# Patient Record
Sex: Male | Born: 1945 | Race: White | Hispanic: No | Marital: Married | Smoking: Former smoker
Health system: Southern US, Community
[De-identification: ages and names within clinical notes are randomized; demographics above are authoritative.]

## PROBLEM LIST (undated history)

## (undated) DIAGNOSIS — Z87898 Personal history of other specified conditions: Secondary | ICD-10-CM

## (undated) DIAGNOSIS — N529 Male erectile dysfunction, unspecified: Secondary | ICD-10-CM

## (undated) DIAGNOSIS — K5790 Diverticulosis of intestine, part unspecified, without perforation or abscess without bleeding: Secondary | ICD-10-CM

## (undated) DIAGNOSIS — I10 Essential (primary) hypertension: Secondary | ICD-10-CM

## (undated) DIAGNOSIS — Z87828 Personal history of other (healed) physical injury and trauma: Secondary | ICD-10-CM

## (undated) DIAGNOSIS — R351 Nocturia: Secondary | ICD-10-CM

## (undated) DIAGNOSIS — D6859 Other primary thrombophilia: Secondary | ICD-10-CM

## (undated) DIAGNOSIS — R6510 Systemic inflammatory response syndrome (SIRS) of non-infectious origin without acute organ dysfunction: Secondary | ICD-10-CM

## (undated) DIAGNOSIS — F32A Depression, unspecified: Secondary | ICD-10-CM

## (undated) DIAGNOSIS — Z8551 Personal history of malignant neoplasm of bladder: Secondary | ICD-10-CM

## (undated) DIAGNOSIS — F329 Major depressive disorder, single episode, unspecified: Secondary | ICD-10-CM

## (undated) DIAGNOSIS — K219 Gastro-esophageal reflux disease without esophagitis: Secondary | ICD-10-CM

## (undated) DIAGNOSIS — Z7901 Long term (current) use of anticoagulants: Secondary | ICD-10-CM

## (undated) DIAGNOSIS — I82890 Acute embolism and thrombosis of other specified veins: Secondary | ICD-10-CM

## (undated) DIAGNOSIS — E785 Hyperlipidemia, unspecified: Secondary | ICD-10-CM

## (undated) DIAGNOSIS — J45909 Unspecified asthma, uncomplicated: Secondary | ICD-10-CM

## (undated) DIAGNOSIS — R972 Elevated prostate specific antigen [PSA]: Secondary | ICD-10-CM

## (undated) DIAGNOSIS — Z87442 Personal history of urinary calculi: Secondary | ICD-10-CM

## (undated) DIAGNOSIS — Z8546 Personal history of malignant neoplasm of prostate: Secondary | ICD-10-CM

## (undated) DIAGNOSIS — A770 Spotted fever due to Rickettsia rickettsii: Secondary | ICD-10-CM

## (undated) DIAGNOSIS — I4891 Unspecified atrial fibrillation: Secondary | ICD-10-CM

## (undated) DIAGNOSIS — M1990 Unspecified osteoarthritis, unspecified site: Secondary | ICD-10-CM

## (undated) DIAGNOSIS — K55059 Acute (reversible) ischemia of intestine, part and extent unspecified: Secondary | ICD-10-CM

## (undated) HISTORY — DX: Gastro-esophageal reflux disease without esophagitis: K21.9

## (undated) HISTORY — DX: Acute (reversible) ischemia of intestine, part and extent unspecified: K55.059

## (undated) HISTORY — DX: Elevated prostate specific antigen (PSA): R97.20

## (undated) HISTORY — DX: Spotted fever due to Rickettsia rickettsii: A77.0

## (undated) HISTORY — DX: Other primary thrombophilia: D68.59

## (undated) HISTORY — DX: Diverticulosis of intestine, part unspecified, without perforation or abscess without bleeding: K57.90

## (undated) HISTORY — PX: SHOULDER ARTHROSCOPY: SHX128

## (undated) HISTORY — PX: KNEE ARTHROSCOPY: SHX127

## (undated) HISTORY — DX: Essential (primary) hypertension: I10

## (undated) HISTORY — PX: TONSILLECTOMY AND ADENOIDECTOMY: SUR1326

## (undated) HISTORY — DX: Systemic inflammatory response syndrome (sirs) of non-infectious origin without acute organ dysfunction: R65.10

## (undated) HISTORY — DX: Unspecified atrial fibrillation: I48.91

## (undated) HISTORY — DX: Hyperlipidemia, unspecified: E78.5

## (undated) HISTORY — PX: ELBOW ARTHROSCOPY: SUR87

## (undated) HISTORY — DX: Unspecified asthma, uncomplicated: J45.909

## (undated) HISTORY — PX: APPENDECTOMY: SHX54

---

## 2003-06-16 ENCOUNTER — Encounter: Payer: Self-pay | Admitting: Internal Medicine

## 2004-01-21 HISTORY — PX: CARDIOVASCULAR STRESS TEST: SHX262

## 2005-12-05 HISTORY — PX: URETEROLITHOTOMY: SHX71

## 2006-04-23 ENCOUNTER — Emergency Department (HOSPITAL_COMMUNITY): Admission: EM | Admit: 2006-04-23 | Discharge: 2006-04-23 | Payer: Self-pay | Admitting: Emergency Medicine

## 2006-04-28 ENCOUNTER — Inpatient Hospital Stay (HOSPITAL_COMMUNITY): Admission: EM | Admit: 2006-04-28 | Discharge: 2006-04-30 | Payer: Self-pay | Admitting: Emergency Medicine

## 2006-04-29 ENCOUNTER — Ambulatory Visit: Payer: Self-pay | Admitting: Internal Medicine

## 2006-05-02 ENCOUNTER — Ambulatory Visit: Payer: Self-pay | Admitting: Internal Medicine

## 2006-05-05 ENCOUNTER — Ambulatory Visit: Payer: Self-pay | Admitting: Internal Medicine

## 2006-05-08 ENCOUNTER — Ambulatory Visit: Payer: Self-pay | Admitting: Internal Medicine

## 2006-05-22 ENCOUNTER — Ambulatory Visit: Payer: Self-pay | Admitting: Internal Medicine

## 2006-06-13 ENCOUNTER — Ambulatory Visit: Payer: Self-pay | Admitting: Internal Medicine

## 2006-07-04 ENCOUNTER — Ambulatory Visit: Payer: Self-pay | Admitting: Internal Medicine

## 2006-08-14 ENCOUNTER — Ambulatory Visit (HOSPITAL_COMMUNITY): Admission: RE | Admit: 2006-08-14 | Discharge: 2006-08-15 | Payer: Self-pay | Admitting: Urology

## 2006-11-09 ENCOUNTER — Ambulatory Visit: Payer: Self-pay | Admitting: Internal Medicine

## 2007-01-29 ENCOUNTER — Ambulatory Visit: Payer: Self-pay | Admitting: Internal Medicine

## 2007-01-29 LAB — CONVERTED CEMR LAB
BUN: 9 mg/dL (ref 6–23)
CO2: 30 meq/L (ref 19–32)
Calcium: 9.7 mg/dL (ref 8.4–10.5)
Chloride: 103 meq/L (ref 96–112)
Creatinine, Ser: 1.2 mg/dL (ref 0.4–1.5)
GFR calc Af Amer: 79 mL/min
GFR calc non Af Amer: 65 mL/min
Glucose, Bld: 86 mg/dL (ref 70–99)
PSA: 4.11 ng/mL — ABNORMAL HIGH (ref 0.10–4.00)
Potassium: 4.4 meq/L (ref 3.5–5.1)
Sodium: 142 meq/L (ref 135–145)
Testosterone: 529.87 ng/dL (ref 350.00–890)

## 2007-02-26 ENCOUNTER — Ambulatory Visit: Payer: Self-pay | Admitting: Internal Medicine

## 2007-04-02 ENCOUNTER — Ambulatory Visit: Payer: Self-pay | Admitting: Internal Medicine

## 2007-06-07 DIAGNOSIS — I10 Essential (primary) hypertension: Secondary | ICD-10-CM | POA: Insufficient documentation

## 2007-08-14 ENCOUNTER — Telehealth: Payer: Self-pay | Admitting: Internal Medicine

## 2007-08-17 ENCOUNTER — Telehealth: Payer: Self-pay | Admitting: Internal Medicine

## 2007-09-24 ENCOUNTER — Ambulatory Visit: Payer: Self-pay | Admitting: Internal Medicine

## 2007-09-24 ENCOUNTER — Telehealth (INDEPENDENT_AMBULATORY_CARE_PROVIDER_SITE_OTHER): Payer: Self-pay | Admitting: *Deleted

## 2007-09-24 ENCOUNTER — Encounter: Payer: Self-pay | Admitting: *Deleted

## 2007-09-24 DIAGNOSIS — D6859 Other primary thrombophilia: Secondary | ICD-10-CM | POA: Insufficient documentation

## 2007-09-24 DIAGNOSIS — Z87442 Personal history of urinary calculi: Secondary | ICD-10-CM | POA: Insufficient documentation

## 2007-09-24 LAB — CONVERTED CEMR LAB
INR: 2.8
Prothrombin Time: 20.4 s

## 2007-09-27 ENCOUNTER — Encounter: Payer: Self-pay | Admitting: Internal Medicine

## 2007-09-28 ENCOUNTER — Encounter: Payer: Self-pay | Admitting: Internal Medicine

## 2007-12-06 HISTORY — PX: OTHER SURGICAL HISTORY: SHX169

## 2007-12-10 ENCOUNTER — Ambulatory Visit: Payer: Self-pay | Admitting: Internal Medicine

## 2007-12-10 DIAGNOSIS — K55059 Acute (reversible) ischemia of intestine, part and extent unspecified: Secondary | ICD-10-CM | POA: Insufficient documentation

## 2007-12-10 LAB — CONVERTED CEMR LAB
INR: 2.8
Prothrombin Time: 20.1 s

## 2008-03-24 ENCOUNTER — Telehealth: Payer: Self-pay | Admitting: Internal Medicine

## 2008-03-26 ENCOUNTER — Ambulatory Visit: Payer: Self-pay | Admitting: Internal Medicine

## 2008-07-05 HISTORY — PX: OTHER SURGICAL HISTORY: SHX169

## 2008-07-09 ENCOUNTER — Telehealth: Payer: Self-pay | Admitting: Internal Medicine

## 2008-07-15 ENCOUNTER — Encounter: Payer: Self-pay | Admitting: Internal Medicine

## 2008-07-16 ENCOUNTER — Telehealth (INDEPENDENT_AMBULATORY_CARE_PROVIDER_SITE_OTHER): Payer: Self-pay | Admitting: *Deleted

## 2008-08-01 ENCOUNTER — Telehealth: Payer: Self-pay | Admitting: Internal Medicine

## 2008-08-02 ENCOUNTER — Ambulatory Visit: Payer: Self-pay | Admitting: Internal Medicine

## 2008-08-02 ENCOUNTER — Telehealth: Payer: Self-pay | Admitting: Internal Medicine

## 2008-08-02 ENCOUNTER — Ambulatory Visit: Payer: Self-pay | Admitting: *Deleted

## 2008-08-02 ENCOUNTER — Encounter: Payer: Self-pay | Admitting: Internal Medicine

## 2008-08-02 ENCOUNTER — Ambulatory Visit: Payer: Self-pay | Admitting: Pulmonary Disease

## 2008-08-02 ENCOUNTER — Inpatient Hospital Stay (HOSPITAL_COMMUNITY): Admission: EM | Admit: 2008-08-02 | Discharge: 2008-08-10 | Payer: Self-pay | Admitting: Emergency Medicine

## 2008-08-03 ENCOUNTER — Ambulatory Visit: Payer: Self-pay | Admitting: Pulmonary Disease

## 2008-08-03 ENCOUNTER — Encounter: Payer: Self-pay | Admitting: Internal Medicine

## 2008-08-03 ENCOUNTER — Ambulatory Visit: Payer: Self-pay | Admitting: Cardiology

## 2008-08-03 ENCOUNTER — Ambulatory Visit: Payer: Self-pay | Admitting: Internal Medicine

## 2008-08-04 ENCOUNTER — Encounter: Payer: Self-pay | Admitting: Internal Medicine

## 2008-08-12 ENCOUNTER — Telehealth: Payer: Self-pay | Admitting: Internal Medicine

## 2008-08-13 ENCOUNTER — Ambulatory Visit: Payer: Self-pay | Admitting: Internal Medicine

## 2008-08-14 ENCOUNTER — Telehealth: Payer: Self-pay | Admitting: Internal Medicine

## 2008-08-15 ENCOUNTER — Telehealth: Payer: Self-pay | Admitting: Internal Medicine

## 2008-08-19 ENCOUNTER — Ambulatory Visit: Payer: Self-pay | Admitting: Internal Medicine

## 2008-08-19 LAB — CONVERTED CEMR LAB: Prothrombin Time: 23.6 s

## 2008-08-20 HISTORY — PX: TRANSTHORACIC ECHOCARDIOGRAM: SHX275

## 2008-08-26 ENCOUNTER — Ambulatory Visit: Payer: Self-pay | Admitting: Internal Medicine

## 2008-08-27 ENCOUNTER — Encounter: Payer: Self-pay | Admitting: Internal Medicine

## 2008-08-29 ENCOUNTER — Telehealth: Payer: Self-pay | Admitting: Internal Medicine

## 2008-09-01 ENCOUNTER — Ambulatory Visit: Payer: Self-pay | Admitting: Internal Medicine

## 2008-09-01 LAB — CONVERTED CEMR LAB
Albumin: 3.3 g/dL — ABNORMAL LOW (ref 3.5–5.2)
Basophils Relative: 0.3 % (ref 0.0–3.0)
Bilirubin, Direct: 0.1 mg/dL (ref 0.0–0.3)
CO2: 31 meq/L (ref 19–32)
Calcium: 9.2 mg/dL (ref 8.4–10.5)
Chloride: 110 meq/L (ref 96–112)
Creatinine, Ser: 1.1 mg/dL (ref 0.4–1.5)
Eosinophils Absolute: 0.2 10*3/uL (ref 0.0–0.7)
GFR calc non Af Amer: 72 mL/min
Glucose, Bld: 112 mg/dL — ABNORMAL HIGH (ref 70–99)
Ketones, ur: NEGATIVE mg/dL
Lymphocytes Relative: 16.2 % (ref 12.0–46.0)
MCHC: 33.9 g/dL (ref 30.0–36.0)
Monocytes Absolute: 0.4 10*3/uL (ref 0.1–1.0)
Platelets: 415 10*3/uL — ABNORMAL HIGH (ref 150–400)
Potassium: 5 meq/L (ref 3.5–5.1)
Prothrombin Time: 20.9 s — ABNORMAL HIGH (ref 10.9–13.3)
RBC: 4.8 M/uL (ref 4.22–5.81)
Total Bilirubin: 0.7 mg/dL (ref 0.3–1.2)
Total Protein, Urine: 30 mg/dL — AB
Total Protein: 7.1 g/dL (ref 6.0–8.3)
pH: 5 (ref 5.0–8.0)

## 2008-09-02 ENCOUNTER — Telehealth: Payer: Self-pay | Admitting: Internal Medicine

## 2008-09-02 ENCOUNTER — Ambulatory Visit: Payer: Self-pay | Admitting: Internal Medicine

## 2008-09-02 ENCOUNTER — Encounter: Payer: Self-pay | Admitting: Internal Medicine

## 2008-09-03 ENCOUNTER — Encounter: Payer: Self-pay | Admitting: Internal Medicine

## 2008-09-09 ENCOUNTER — Ambulatory Visit: Payer: Self-pay | Admitting: Internal Medicine

## 2008-09-10 ENCOUNTER — Telehealth: Payer: Self-pay | Admitting: Internal Medicine

## 2008-09-11 ENCOUNTER — Encounter: Payer: Self-pay | Admitting: Internal Medicine

## 2008-09-12 ENCOUNTER — Encounter: Payer: Self-pay | Admitting: Internal Medicine

## 2008-09-17 ENCOUNTER — Telehealth: Payer: Self-pay | Admitting: Internal Medicine

## 2008-09-23 ENCOUNTER — Encounter: Payer: Self-pay | Admitting: Internal Medicine

## 2008-09-25 ENCOUNTER — Ambulatory Visit: Payer: Self-pay | Admitting: Internal Medicine

## 2008-10-02 ENCOUNTER — Ambulatory Visit: Payer: Self-pay | Admitting: Internal Medicine

## 2008-10-20 ENCOUNTER — Encounter: Admission: RE | Admit: 2008-10-20 | Discharge: 2008-10-20 | Payer: Self-pay | Admitting: Orthopaedic Surgery

## 2008-11-06 ENCOUNTER — Ambulatory Visit: Payer: Self-pay | Admitting: Internal Medicine

## 2008-11-06 DIAGNOSIS — I4891 Unspecified atrial fibrillation: Secondary | ICD-10-CM | POA: Insufficient documentation

## 2008-11-06 LAB — CONVERTED CEMR LAB: INR: 1.2

## 2008-11-12 ENCOUNTER — Ambulatory Visit: Payer: Self-pay | Admitting: Cardiology

## 2008-11-12 ENCOUNTER — Encounter: Payer: Self-pay | Admitting: Cardiology

## 2008-11-12 ENCOUNTER — Ambulatory Visit: Payer: Self-pay | Admitting: Internal Medicine

## 2008-11-12 ENCOUNTER — Ambulatory Visit: Payer: Self-pay

## 2008-11-17 ENCOUNTER — Ambulatory Visit: Payer: Self-pay | Admitting: Cardiology

## 2008-11-19 ENCOUNTER — Telehealth: Payer: Self-pay | Admitting: Internal Medicine

## 2008-11-20 ENCOUNTER — Encounter: Payer: Self-pay | Admitting: Internal Medicine

## 2008-11-26 ENCOUNTER — Ambulatory Visit: Payer: Self-pay | Admitting: Cardiovascular Disease

## 2008-12-10 ENCOUNTER — Ambulatory Visit: Payer: Self-pay | Admitting: Internal Medicine

## 2008-12-15 ENCOUNTER — Ambulatory Visit: Payer: Self-pay | Admitting: Cardiology

## 2008-12-23 ENCOUNTER — Ambulatory Visit: Payer: Self-pay | Admitting: Cardiology

## 2008-12-30 ENCOUNTER — Ambulatory Visit: Payer: Self-pay | Admitting: Cardiology

## 2009-01-05 ENCOUNTER — Ambulatory Visit: Payer: Self-pay | Admitting: Cardiology

## 2009-01-19 ENCOUNTER — Ambulatory Visit: Payer: Self-pay | Admitting: Internal Medicine

## 2009-01-20 ENCOUNTER — Ambulatory Visit (HOSPITAL_COMMUNITY): Admission: RE | Admit: 2009-01-20 | Discharge: 2009-01-20 | Payer: Self-pay | Admitting: Cardiology

## 2009-01-20 ENCOUNTER — Ambulatory Visit: Payer: Self-pay | Admitting: Cardiology

## 2009-01-30 ENCOUNTER — Ambulatory Visit: Payer: Self-pay | Admitting: Cardiology

## 2009-02-05 ENCOUNTER — Ambulatory Visit: Payer: Self-pay | Admitting: Cardiology

## 2009-03-02 ENCOUNTER — Telehealth: Payer: Self-pay | Admitting: Internal Medicine

## 2009-03-31 ENCOUNTER — Telehealth: Payer: Self-pay | Admitting: Internal Medicine

## 2009-04-04 DIAGNOSIS — E785 Hyperlipidemia, unspecified: Secondary | ICD-10-CM | POA: Insufficient documentation

## 2009-04-06 ENCOUNTER — Telehealth (INDEPENDENT_AMBULATORY_CARE_PROVIDER_SITE_OTHER): Payer: Self-pay | Admitting: Cardiology

## 2009-04-06 ENCOUNTER — Ambulatory Visit: Payer: Self-pay | Admitting: Cardiology

## 2009-04-14 ENCOUNTER — Telehealth: Payer: Self-pay | Admitting: Internal Medicine

## 2009-05-05 ENCOUNTER — Encounter: Payer: Self-pay | Admitting: *Deleted

## 2009-05-18 ENCOUNTER — Ambulatory Visit: Payer: Self-pay | Admitting: Cardiology

## 2009-05-18 ENCOUNTER — Encounter: Payer: Self-pay | Admitting: Internal Medicine

## 2009-05-20 ENCOUNTER — Telehealth: Payer: Self-pay | Admitting: Internal Medicine

## 2009-05-25 ENCOUNTER — Telehealth (INDEPENDENT_AMBULATORY_CARE_PROVIDER_SITE_OTHER): Payer: Self-pay | Admitting: *Deleted

## 2009-06-10 ENCOUNTER — Encounter: Payer: Self-pay | Admitting: *Deleted

## 2009-06-23 ENCOUNTER — Ambulatory Visit: Payer: Self-pay | Admitting: Internal Medicine

## 2009-06-23 LAB — CONVERTED CEMR LAB: INR: 1.5

## 2009-07-23 ENCOUNTER — Encounter: Payer: Self-pay | Admitting: Internal Medicine

## 2009-08-17 ENCOUNTER — Telehealth: Payer: Self-pay | Admitting: Internal Medicine

## 2009-08-27 ENCOUNTER — Telehealth: Payer: Self-pay | Admitting: Internal Medicine

## 2009-08-28 ENCOUNTER — Ambulatory Visit: Payer: Self-pay | Admitting: Internal Medicine

## 2009-08-28 DIAGNOSIS — R413 Other amnesia: Secondary | ICD-10-CM | POA: Insufficient documentation

## 2009-08-31 LAB — CONVERTED CEMR LAB
Albumin: 4 g/dL (ref 3.5–5.2)
Alkaline Phosphatase: 59 units/L (ref 39–117)
Basophils Relative: 0.3 % (ref 0.0–3.0)
CO2: 29 meq/L (ref 19–32)
Chloride: 104 meq/L (ref 96–112)
Eosinophils Absolute: 0.3 10*3/uL (ref 0.0–0.7)
Folate: 16.5 ng/mL
HCT: 53.7 % — ABNORMAL HIGH (ref 39.0–52.0)
Hemoglobin: 17.6 g/dL — ABNORMAL HIGH (ref 13.0–17.0)
Lymphocytes Relative: 31.6 % (ref 12.0–46.0)
MCHC: 32.7 g/dL (ref 30.0–36.0)
MCV: 88.4 fL (ref 78.0–100.0)
Monocytes Absolute: 0.4 10*3/uL (ref 0.1–1.0)
Neutro Abs: 3 10*3/uL (ref 1.4–7.7)
RBC: 6.08 M/uL — ABNORMAL HIGH (ref 4.22–5.81)
Sodium: 140 meq/L (ref 135–145)
Total Protein: 7.1 g/dL (ref 6.0–8.3)

## 2009-09-12 ENCOUNTER — Encounter: Admission: RE | Admit: 2009-09-12 | Discharge: 2009-09-12 | Payer: Self-pay | Admitting: Internal Medicine

## 2009-09-16 ENCOUNTER — Telehealth: Payer: Self-pay | Admitting: Internal Medicine

## 2009-09-30 ENCOUNTER — Ambulatory Visit: Payer: Self-pay | Admitting: Internal Medicine

## 2009-10-02 LAB — CONVERTED CEMR LAB
Basophils Relative: 0.3 % (ref 0.0–3.0)
Eosinophils Absolute: 0.2 10*3/uL (ref 0.0–0.7)
Lymphs Abs: 2.1 10*3/uL (ref 0.7–4.0)
MCHC: 33.7 g/dL (ref 30.0–36.0)
MCV: 89 fL (ref 78.0–100.0)
Monocytes Absolute: 0.7 10*3/uL (ref 0.1–1.0)
Neutro Abs: 4 10*3/uL (ref 1.4–7.7)
Neutrophils Relative %: 56.6 % (ref 43.0–77.0)
RBC: 5.81 M/uL (ref 4.22–5.81)

## 2009-10-28 ENCOUNTER — Ambulatory Visit: Payer: Self-pay | Admitting: Internal Medicine

## 2009-10-28 LAB — CONVERTED CEMR LAB: INR: 1.3
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
## 2009-11-07 IMAGING — CR DG CHEST 1V PORT
1 series · 1 of 1 positions shown · non-contrast
Comparison: 08/04/2008

CLINICAL DATA: Pneumonia.  Left thoracentesis.

PORTABLE CHEST - 1 VIEW

[view not recorded]
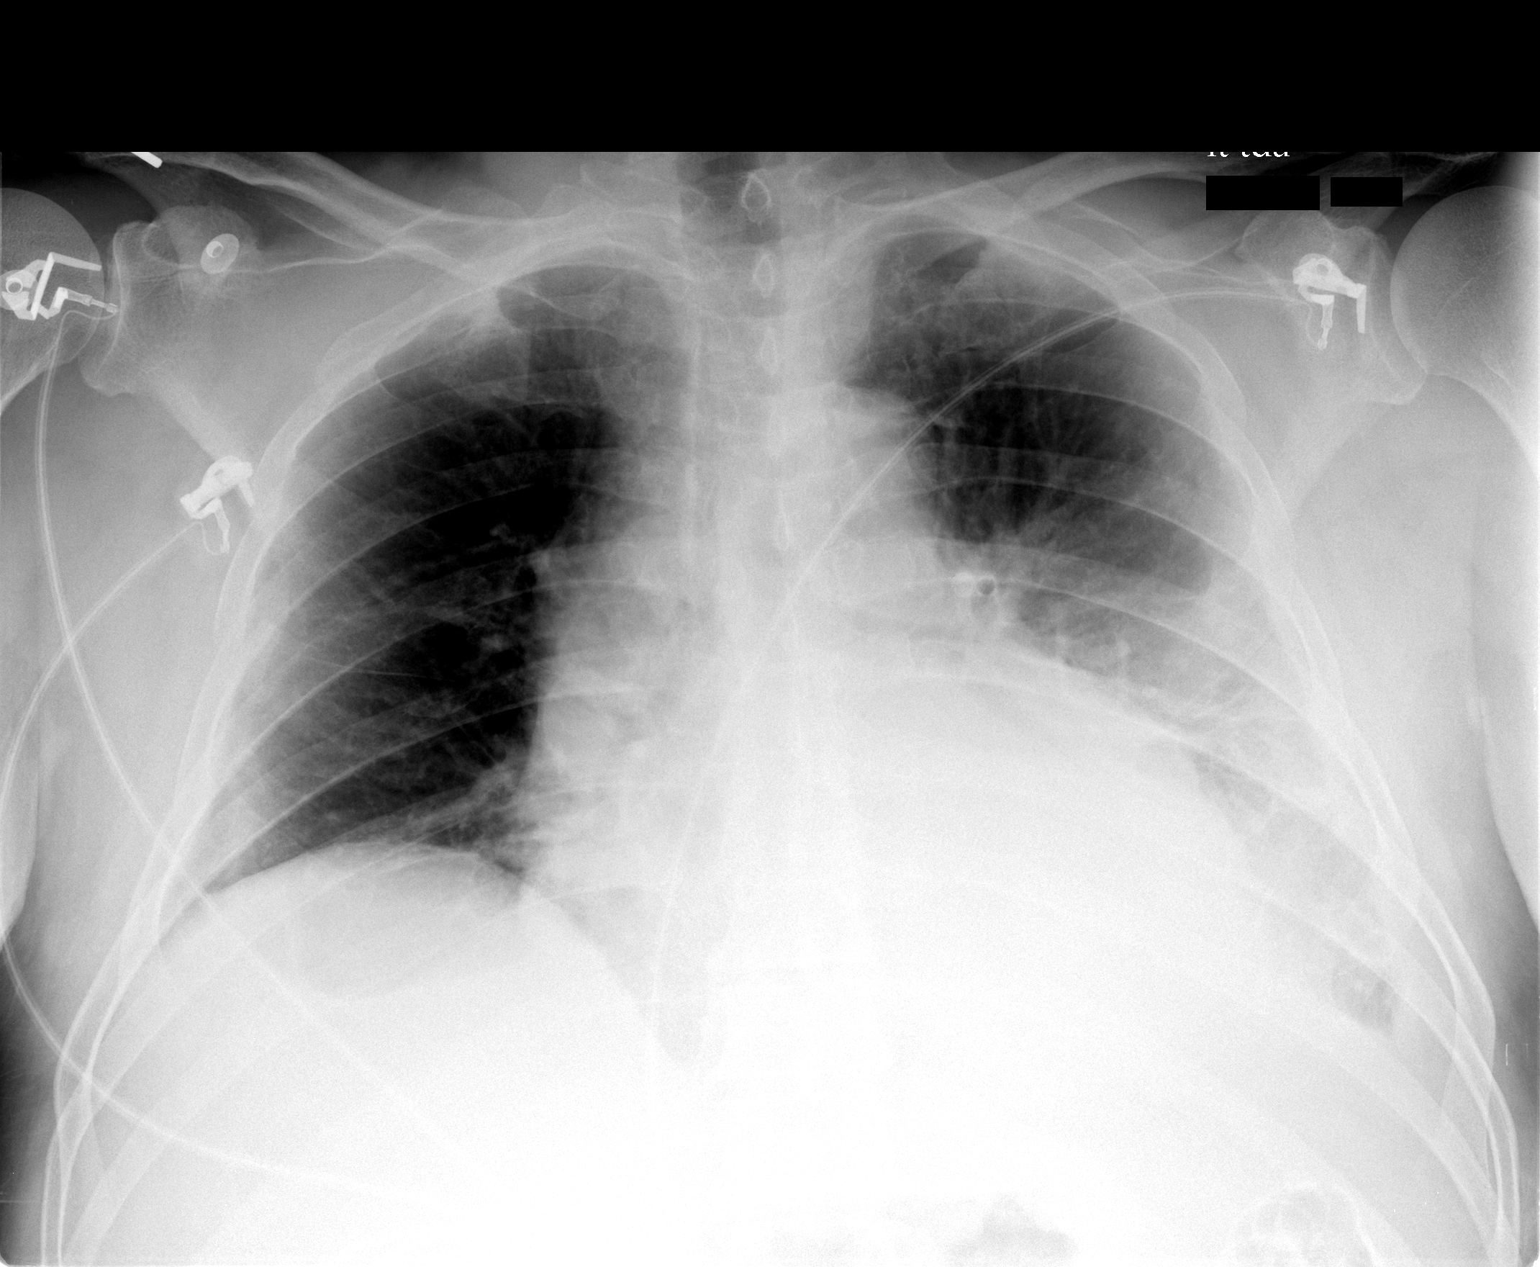

[1 of 1 positions shown; findings below may reference images not displayed]

FINDINGS: Negative for pneumothorax post left thoracentesis.  There
remains left pleural effusion and left lower lobe consolidation
which are unchanged.  There is mild right lower lobe atelectasis.
There is no significant effusion on the right.
IMPRESSION: No pneumothorax following left thoracentesis.

No change in left lower lobe consolidation and effusion.

## 2009-11-10 ENCOUNTER — Telehealth: Payer: Self-pay | Admitting: Internal Medicine

## 2009-11-11 ENCOUNTER — Telehealth: Payer: Self-pay | Admitting: Internal Medicine

## 2009-11-11 ENCOUNTER — Ambulatory Visit: Payer: Self-pay | Admitting: Internal Medicine

## 2009-11-12 ENCOUNTER — Telehealth (INDEPENDENT_AMBULATORY_CARE_PROVIDER_SITE_OTHER): Payer: Self-pay | Admitting: *Deleted

## 2009-11-20 ENCOUNTER — Telehealth: Payer: Self-pay | Admitting: Internal Medicine

## 2009-11-26 ENCOUNTER — Telehealth: Payer: Self-pay | Admitting: Cardiology

## 2009-12-08 ENCOUNTER — Encounter: Payer: Self-pay | Admitting: Internal Medicine

## 2010-01-05 ENCOUNTER — Ambulatory Visit: Payer: Self-pay | Admitting: Family Medicine

## 2010-02-19 ENCOUNTER — Ambulatory Visit: Payer: Self-pay | Admitting: Internal Medicine

## 2010-02-19 LAB — CONVERTED CEMR LAB: INR: 2.8

## 2010-04-22 ENCOUNTER — Telehealth: Payer: Self-pay | Admitting: Internal Medicine

## 2010-05-07 ENCOUNTER — Telehealth: Payer: Self-pay | Admitting: Internal Medicine

## 2010-05-10 ENCOUNTER — Telehealth: Payer: Self-pay | Admitting: Internal Medicine

## 2010-05-27 ENCOUNTER — Ambulatory Visit: Payer: Self-pay | Admitting: Family Medicine

## 2010-05-27 LAB — CONVERTED CEMR LAB: INR: 1.4

## 2010-08-24 ENCOUNTER — Encounter: Payer: Self-pay | Admitting: Internal Medicine

## 2010-08-27 ENCOUNTER — Telehealth: Payer: Self-pay | Admitting: *Deleted

## 2010-09-17 ENCOUNTER — Ambulatory Visit: Admission: RE | Admit: 2010-09-17 | Discharge: 2010-10-21 | Payer: Self-pay | Admitting: Radiation Oncology

## 2010-09-27 ENCOUNTER — Telehealth: Payer: Self-pay | Admitting: Internal Medicine

## 2010-10-05 ENCOUNTER — Encounter: Payer: Self-pay | Admitting: Internal Medicine

## 2010-10-07 ENCOUNTER — Ambulatory Visit: Payer: Self-pay | Admitting: Internal Medicine

## 2010-10-07 DIAGNOSIS — C61 Malignant neoplasm of prostate: Secondary | ICD-10-CM | POA: Insufficient documentation

## 2010-10-08 LAB — CONVERTED CEMR LAB
Albumin: 3.9 g/dL (ref 3.5–5.2)
Basophils Absolute: 0 10*3/uL (ref 0.0–0.1)
Basophils Relative: 0.7 % (ref 0.0–3.0)
Chloride: 104 meq/L (ref 96–112)
Cholesterol: 247 mg/dL — ABNORMAL HIGH (ref 0–200)
HDL: 41 mg/dL (ref >39.00)
Hemoglobin: 17.3 g/dL — ABNORMAL HIGH (ref 13.0–17.0)
Lymphocytes Relative: 29.7 % (ref 12.0–46.0)
Monocytes Relative: 8.1 % (ref 3.0–12.0)
Neutro Abs: 4.2 10*3/uL (ref 1.4–7.7)
Neutrophils Relative %: 56.7 % (ref 43.0–77.0)
Potassium: 5.4 meq/L — ABNORMAL HIGH (ref 3.5–5.1)
RBC: 5.99 M/uL — ABNORMAL HIGH (ref 4.22–5.81)
Sodium: 140 meq/L (ref 135–145)
TSH: 2.53 microintl units/mL (ref 0.35–5.50)
Total CHOL/HDL Ratio: 6
Triglycerides: 359 mg/dL — ABNORMAL HIGH (ref 0.0–149.0)
VLDL: 71.8 mg/dL — ABNORMAL HIGH (ref 0.0–40.0)

## 2010-10-18 ENCOUNTER — Telehealth: Payer: Self-pay | Admitting: Internal Medicine

## 2010-10-19 ENCOUNTER — Telehealth: Payer: Self-pay | Admitting: Gastroenterology

## 2010-10-22 ENCOUNTER — Encounter: Payer: Self-pay | Admitting: Internal Medicine

## 2010-10-22 ENCOUNTER — Encounter: Payer: Self-pay | Admitting: Gastroenterology

## 2010-10-22 ENCOUNTER — Ambulatory Visit: Payer: Self-pay | Admitting: Nurse Practitioner

## 2010-10-22 DIAGNOSIS — R198 Other specified symptoms and signs involving the digestive system and abdomen: Secondary | ICD-10-CM | POA: Insufficient documentation

## 2010-10-22 DIAGNOSIS — R197 Diarrhea, unspecified: Secondary | ICD-10-CM | POA: Insufficient documentation

## 2010-11-08 ENCOUNTER — Ambulatory Visit: Payer: Self-pay | Admitting: Gastroenterology

## 2010-11-08 LAB — HM COLONOSCOPY

## 2010-11-10 ENCOUNTER — Telehealth: Payer: Self-pay | Admitting: Gastroenterology

## 2010-11-10 ENCOUNTER — Encounter: Payer: Self-pay | Admitting: Gastroenterology

## 2010-11-12 ENCOUNTER — Telehealth: Payer: Self-pay | Admitting: Gastroenterology

## 2010-11-15 ENCOUNTER — Ambulatory Visit: Payer: Self-pay | Admitting: Gastroenterology

## 2010-11-26 ENCOUNTER — Ambulatory Visit: Payer: Self-pay | Admitting: Internal Medicine

## 2010-11-26 ENCOUNTER — Ambulatory Visit
Admission: RE | Admit: 2010-11-26 | Discharge: 2011-01-04 | Payer: Self-pay | Source: Home / Self Care | Attending: Radiation Oncology | Admitting: Radiation Oncology

## 2010-11-26 DIAGNOSIS — R059 Cough, unspecified: Secondary | ICD-10-CM | POA: Insufficient documentation

## 2010-11-26 DIAGNOSIS — R05 Cough: Secondary | ICD-10-CM

## 2010-11-30 ENCOUNTER — Ambulatory Visit
Admission: RE | Admit: 2010-11-30 | Discharge: 2010-11-30 | Payer: Self-pay | Source: Home / Self Care | Attending: Family Medicine | Admitting: Family Medicine

## 2010-11-30 DIAGNOSIS — J209 Acute bronchitis, unspecified: Secondary | ICD-10-CM | POA: Insufficient documentation

## 2010-12-18 ENCOUNTER — Encounter: Payer: Self-pay | Admitting: Internal Medicine

## 2010-12-18 DIAGNOSIS — I4891 Unspecified atrial fibrillation: Secondary | ICD-10-CM

## 2010-12-27 ENCOUNTER — Encounter: Payer: Self-pay | Admitting: Pulmonary Disease

## 2011-01-05 ENCOUNTER — Ambulatory Visit: Payer: BC Managed Care – PPO | Attending: Radiation Oncology | Admitting: Radiation Oncology

## 2011-01-05 DIAGNOSIS — R351 Nocturia: Secondary | ICD-10-CM | POA: Insufficient documentation

## 2011-01-05 DIAGNOSIS — Z510 Encounter for antineoplastic radiation therapy: Secondary | ICD-10-CM | POA: Insufficient documentation

## 2011-01-05 DIAGNOSIS — C61 Malignant neoplasm of prostate: Secondary | ICD-10-CM | POA: Insufficient documentation

## 2011-01-06 NOTE — Letter (Signed)
Summary: Anticoagulation/[facility]  Anticoagulation/[facility]   Imported By: Sherian Rein 10/27/2010 12:37:46  _____________________________________________________________________  External Attachment:    Type:   Image     Comment:   External Document

## 2011-01-06 NOTE — Assessment & Plan Note (Signed)
Summary: pnemonia not getting any better/cjr   Vital Signs:  Patient profile:   65 year old male Temp:     98.4 degrees F oral BP sitting:   110 / 78  (left arm) Cuff size:   large  Vitals Entered By: Sid Falcon LPN (November 30, 2010 11:01 AM)  History of Present Illness: Here with continued URI symptoms for the past week. He is wheezing and coughing up brown sputum. No fever or chest pain. Using Mucinex and hydrocodone cough syrup. He was seen here on 11-26-10 and was given a Zpack. This has not helped.   Allergies: 1)  ! Sulfamethoxazole-Trimethoprim (Sulfamethoxazole-Trimethoprim)  Past History:  Past Medical History: Reviewed history from 11/10/2010 and no changes required. 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   left lower lobe pneumonia.  secondary  to above . [facility] spotted fever.  Prostate Cancer Diverticulosis  Review of Systems  The patient denies anorexia, fever, weight loss, weight gain, vision loss, decreased hearing, hoarseness, chest pain, syncope, dyspnea on exertion, peripheral edema, headaches, hemoptysis, abdominal pain, melena, hematochezia, severe indigestion/heartburn, hematuria, incontinence, genital sores, muscle weakness,  suspicious skin lesions, transient blindness, difficulty walking, depression, unusual weight change, abnormal bleeding, enlarged lymph nodes, angioedema, breast masses, and testicular masses.    Physical Exam  General:  Well-developed,well-nourished,in no acute distress; alert,appropriate and cooperative throughout examination Head:  Normocephalic and atraumatic without obvious abnormalities. No apparent alopecia or balding. Eyes:  No corneal or conjunctival inflammation noted. EOMI. Perrla. Funduscopic exam benign, without hemorrhages, exudates or papilledema. Vision grossly normal. Ears:  External ear exam shows no significant lesions or deformities.  Otoscopic examination reveals clear canals, tympanic membranes are intact bilaterally without bulging, retraction, inflammation or discharge. Hearing is grossly normal bilaterally. Nose:  External nasal examination shows no deformity or inflammation. Nasal mucosa are pink and moist without lesions or exudates. Mouth:  Oral mucosa and oropharynx without lesions or exudates.  Teeth in good repair. Neck:  No deformities, masses, or tenderness noted. Lungs:  scattered rhonchi and wheezes, no rales  Heart:  Normal rate and regular rhythm. S1 and S2 normal without gallop, murmur, click, rub or other extra sounds.   Impression & Recommendations:  Problem # 1:  ACUTE BRONCHITIS (ICD-466.0)  The following medications were removed from the medication list:    Zithromax Z-pak 250 Mg Tabs (Azithromycin) .Marland Kitchen..Marland Kitchen Two initially, then one daily for 4 days His updated medication list for this problem includes:    Hydrocodone-homatropine 5-1.5 Mg/67ml Syrp (Hydrocodone-homatropine) .Marland Kitchen... 1 teaspoon every 4  hours as needed for cough    Levaquin 500 Mg Tabs (Levofloxacin) ..... Once daily    Proair Hfa 108 (90 Base) Mcg/act Aers (Albuterol sulfate) .Marland Kitchen... 2 puffs q 4 hours as needed  Orders: Depo- Medrol 40mg  (J1030) Admin of Therapeutic Inj  intramuscular or  subcutaneous (04540)  Complete Medication List: 1)  Warfarin Sodium 5 Mg Tabs (Warfarin sodium) .... By mouth daily as directed 2)  Lisinopril 40 Mg Tabs (Lisinopril) .... Take one tablet by mouth daily 3)  Nexium 40  Mg Cpdr (Esomeprazole magnesium) .... Take 1 capsule by mouth once a day 4)  Viagra 50 Mg Tabs (Sildenafil citrate) .... As needed as directed 5)  Mucinex Nasal Spray Moisture 0.05 % Soln (Oxymetazoline hcl) .... As needed 6)  Metoprolol Succinate 50 Mg Xr24h-tab (Metoprolol succinate) .... Take one and one half tablet by mouth daily 7)  Celebrex 200 Mg Caps (Celecoxib) .... One by mouth daily90 8)  Glucosamine-chondroitin Caps (Glucosamine-chondroit-vit c-mn) 9)  Tramadol Hcl 50 Mg Tabs (Tramadol hcl) .Marland Kitchen.. 1-2 q 4-6 hours as needed 10)  Hydrocodone-acetaminophen 10-325 Mg Tabs (Hydrocodone-acetaminophen) .... 1/2 to 1 by mouth q  6  hours as needed  for pain.Marland Kitchen 11)  Adderall Xr 15 Mg Xr24h-cap (Amphetamine-dextroamphetamine) .... Take 1 capsule by mouth once a day do not fill until 12/07/10 12)  Zofran 50 Mg  .... Take 1 every 4-6 hours as needed for nausea 13)  Hydrocodone-homatropine 5-1.5 Mg/44ml Syrp (Hydrocodone-homatropine) .Marland Kitchen.. 1 teaspoon every 4  hours as needed for cough 14)  Levaquin 500 Mg Tabs (Levofloxacin) .... Once daily 15)  Proair Hfa 108 (90 Base) Mcg/act Aers (Albuterol sulfate) .... 2 puffs q 4 hours as needed  Patient Instructions: 1)  Switch to Levaquin and add an inhaler as needed .  2)  Please schedule a follow-up appointment as needed .  Prescriptions: HYDROCODONE-HOMATROPINE 5-1.5 MG/5ML SYRP (HYDROCODONE-HOMATROPINE) 1 teaspoon every 4  hours as needed for cough  #240 x 0   Entered and Authorized by:   Nelwyn Salisbury MD   Signed by:   Nelwyn Salisbury MD on 11/30/2010   Method used:   Print then Give to Patient   RxID:   [id number] PROAIR HFA 108 (90 BASE) MCG/ACT AERS (ALBUTEROL SULFATE) 2 puffs q 4 hours as needed  #1 x 2   Entered and Authorized  by:   Nelwyn Salisbury MD   Signed by:   Nelwyn Salisbury MD on 11/30/2010   Method used:   Electronically to        [facility]  [address] [phone]* (retail)       [address]       [place], [place]  [address]       Ph: [phone] or [phone]       Fax: [phone]   RxID:   [id number] LEVAQUIN 500 MG TABS (LEVOFLOXACIN) once daily  #10 x 0   Entered and Authorized by:   Nelwyn Salisbury MD   Signed by:   Nelwyn Salisbury MD on 11/30/2010   Method used:   [facility] to        [facility]  [address] [phone]* (retail)       [address]       [place], [place]  [address]       Ph: [phone] or [phone]       Fax: [phone]   RxID:   [id number]    Medication Administration  Injection # 1:    Medication: Depo- Medrol 40mg     Diagnosis: ACUTE BRONCHITIS (ICD-466.0)    Route: IM    Site: LUOQ gluteus    Exp Date: 06/04/2013    Lot #: [id number]    Mfr: Pharmacia    Comments: 120 ml ordered and given    Patient tolerated injection without complications    Given by: Sid Falcon LPN (November 30, 2010 11:42 AM)  Orders Added: 1)  Est. Patient Level IV [62703] 2)  Depo- Medrol 40mg  [J1030] 3)  Admin of Therapeutic Inj  intramuscular or subcutaneous [16109]

## 2011-01-06 NOTE — Procedures (Signed)
Summary: Colonoscopy/[facility]  Colonoscopy/[facility]   Imported By: Lanelle Bal 10/07/2010 15:42:56  _____________________________________________________________________  External Attachment:    Type:   Image     Comment:   External Document

## 2011-01-06 NOTE — Progress Notes (Signed)
Summary: Pt cancelled follow up appointment   ---- Converted from flag ---- ---- 11/12/2010 11:51 AM, [place] Johnson wrote: Mathew Randolph...this pt canceled appt for monday because he has gotten all the results he needs and no longer feels like he needs the visit ------------------------------ Noted will route to Dr Arlyce Dice  [facility]

## 2011-01-06 NOTE — Progress Notes (Signed)
Summary: REFILL REQUEST (Adderall)  Phone Note Refill Request Message from:  Patient on May 07, 2010 9:03 AM  Refills Requested: Medication #1:  ADDERALL XR 15 MG XR24H-CAP Take 1 capsule by mouth once a day --FILL IN ONE MONTH   Notes: Pt can be reached at [phone] or his wife (Jan) at [phone] when Rx is ready for p/u.  Awaiting Dr. Cato Mulligan' signature.  Rudy Jew, RN  May 07, 2010 4:54 PM   Initial call taken by: Debbra Riding,  May 07, 2010 9:03 AM  Follow-up for Phone Call        called for pick-up Follow-up by: Raechel Ache, RN,  May 10, 2010 8:26 AM    Prescriptions: ADDERALL XR 15 MG XR24H-CAP (AMPHETAMINE-DEXTROAMPHETAMINE) Take 1 capsule by mouth once a day --FILL IN TWO MONTHS  #30 x 0   Entered by:   Rudy Jew, RN   Authorized by:   Birdie Sons MD   Signed by:   Rudy Jew, RN on 05/07/2010   Method used:   Print then Give to Patient   RxID:   [id number] ADDERALL XR 15 MG XR24H-CAP (AMPHETAMINE-DEXTROAMPHETAMINE) Take 1 capsule by mouth once a day --FILL IN ONE MONTH  #30 x 0   Entered by:   Rudy Jew, RN   Authorized by:   Birdie Sons MD   Signed by:   Rudy Jew, RN on 05/07/2010   Method used:   Print then Give to Patient   RxID:   [id number] ADDERALL XR 15 MG XR24H-CAP (AMPHETAMINE-DEXTROAMPHETAMINE) Take 1 capsule by mouth once a day  #30 x 0   Entered by:   Rudy Jew, RN   Authorized by:   Birdie Sons MD   Signed by:   Rudy Jew, RN on 05/07/2010   Method used:   Print then Give to Patient   RxID:   [id number]

## 2011-01-06 NOTE — Assessment & Plan Note (Signed)
Summary: emp--will fast//ccm   Vital Signs:  Patient profile:   65 year old male Height:      70 inches Weight:      218 pounds BMI:     31.39 Temp:     98.1 degrees F oral Pulse rate:   84 / minute Pulse rhythm:   regular BP sitting:   130 / 80  (left arm) Cuff size:   large  Vitals Entered By: Alfred Levins, CMA (October 07, 2010 8:18 AM) CC: cpx, fasting   CC:  cpx and fasting.  History of Present Illness: Here for Medicare AWV:  1.   Risk factors based on Past M, S, F history: 2.   Physical Activities:  3.   Depression/mood:  4.   Hearing:  5.   ADL's:  6.   Fall Risk:  7.   Home Safety:  8.   Height, weight, &visual acuity: 9.   Counseling:  10.   Labs ordered based on risk factors:  11.           Referral Coordination 12.           Care Plan 13.            Cognitive Assessment   Current Problems:  ATRIAL FIBRILLATION (ICD-427.31) HYPERTENSION (ICD-401.9) HYPERLIPIDEMIA (ICD-272.4) MESENTERIC VENOUS THROMBOSIS (ICD-557.0) PSA, INCREASED (ICD-790.93) HYPERCOAGULABLE STATE, PRIMARY (ICD-289.81)    Current Problems (verified): 1)  Memory Loss  (ICD-780.93) 2)  Atrial Fibrillation  (ICD-427.31) 3)  Hypertension  (ICD-401.9) 4)  Hyperlipidemia  (ICD-272.4) 5)  Coumadin Therapy  (ICD-V58.61) 6)  Encounter For Therapeutic Drug Monitoring  (ICD-V58.83) 7)  Mesenteric Venous Thrombosis  (ICD-557.0) 8)  Psa, Increased  (ICD-790.93) 9)  Hypercoagulable State, Primary  (ICD-289.81) 10)  Nephrolithiasis, Hx of  (ICD-V13.01)  Current Medications (verified): 1)  Warfarin Sodium 5 Mg Tabs (Warfarin Sodium) .... By Mouth Daily As Directed 2)  Lisinopril 40 Mg Tabs (Lisinopril) .... Take One Tablet By Mouth Daily 3)  Nexium 40 Mg  Cpdr (Esomeprazole Magnesium) .... Take 1 Capsule By Mouth Once A Day 4)  Viagra 50 Mg  Tabs (Sildenafil Citrate) .... As Needed As Directed 5)  Mucinex Nasal Spray Moisture 0.05 % Soln (Oxymetazoline Hcl) .... As Needed 6)  Metoprolol  Succinate 50 Mg Xr24h-Tab (Metoprolol Succinate) .... Take One and One Half Tablet By Mouth Daily 7)  Celebrex 200 Mg Caps (Celecoxib) .... One By Mouth Daily90 8)  Glucosamine-Chondroitin   Caps (Glucosamine-Chondroit-Vit C-Mn) 9)  Tramadol Hcl 50 Mg Tabs (Tramadol Hcl) .Marland Kitchen.. 1-2 Q 4-6 Hours As Needed 10)  Hydrocodone-Acetaminophen 10-325 Mg Tabs (Hydrocodone-Acetaminophen) .... 1/2 To 1 By Mouth Q  6  Hours As Needed  For Pain.Marland Kitchen 11)  Adderall Xr 15 Mg Xr24h-Cap (Amphetamine-Dextroamphetamine) .... Take 1 Capsule By Mouth Once A Day 12)  Doxycycline Hyclate 100 Mg Caps (Doxycycline Hyclate) .... One By Mouth Two Times A Day For 10 Days  Allergies (verified): 1)  ! Sulfamethoxazole-Trimethoprim (Sulfamethoxazole-Trimethoprim)  Past History:  Past Medical History: Last updated: 06/23/2009 Current Problems:  ATRIAL FIBRILLATION (ICD-427.31) cardioversion in MAY  HYPERTENSION (ICD-401.9) CANDIDIASIS, SKIN (ICD-112.3) GERD (ICD-530.81) HYPERLIPIDEMIA (ICD-272.4) PERICARDIAL EFFUSION (ICD-423.9) TOE PAIN (ICD-729.5) URI (ICD-465.9) GROIN PAIN (ICD-789.09) EPIDIDYMITIS (ICD-604.90) COUMADIN THERAPY (ICD-V58.61) ENCOUNTER FOR THERAPEUTIC DRUG MONITORING (ICD-V58.83) HYPERKALEMIA (ICD-276.7) RENAL FAILURE, ACUTE (ICD-584.9) SYSTEMIC INFLAMMATORY RESPONSE SYNDROME UNSPEC (ICD-995.90) PNEUMONIA, ORGANISM UNSPECIFIED (ICD-486) MESENTERIC VENOUS THROMBOSIS (ICD-557.0) DISEASE, NAIL NEC (ICD-703.8) PSA, INCREASED (ICD-790.93) HYPERCOAGULABLE STATE, PRIMARY (ICD-289.81) NEPHROLITHIASIS, HX OF (ICD-V13.01)  fracture to sternum,  broken ribs, and fractured pelvis.   left lower lobe pneumonia.  secondary  to above . [facility] spotted fever.   Past Surgical History: Last updated: 06/23/2009 colonoscopy-2006   arthroscopic knee surgeries appendectomy tonsillectomy [place] [facility] Filter  August  2009  Family History: Last updated: 08/02/2008 2 cousins with blood clots--one sounds arterial.      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 HEENT exam atraumatic, normocephalic symmetric her muscles are intact. Neck is supple without lymphadenopathy, thyromegaly, jugular venous distention or carotid bruits. Chest clear to auscultation without increased work of breathing. Cardiac exam S1-S2 are normal without murmurs Belzberg abdominal exam active bowel sounds, soft and nontender. Extremities no clubbing cyanosis or edema. Prostate exam not performed, recently performed by urology and radiation oncology.   Impression & Recommendations:  Problem # 1:  PREVENTIVE HEALTH CARE (ICD-V70.0) advised regular exercise, low fat diet and weight loss. He is undergoing evaluation for prostate cancer treatment. I think he is healthy enough to pursue any modality of treatment that he desires. Orders: Welcome to [employer], Physical [phone])  Problem # 2:  ADENOCARCINOMA, PROSTATE (ICD-185)  followed by urology  Orders: TLB-CBC Platelet - w/Differential (85025-CBCD)  Problem # 3:  ATRIAL FIBRILLATION (ICD-427.31) no recurrence  His updated medication list for this problem includes:    Warfarin Sodium 5 Mg Tabs (Warfarin sodium) ..... By mouth daily as directed    Metoprolol Succinate 50 Mg Xr24h-tab (Metoprolol succinate) .Marland Kitchen... 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 Lifelong warfarin therapy. Orders: Protime ([id number])  Complete Medication List: 1)  Warfarin Sodium 5 Mg Tabs (Warfarin sodium) .... By mouth daily as directed 2)  Lisinopril 40 Mg Tabs (Lisinopril) .... Take one tablet by mouth daily 3)  Nexium 40 Mg Cpdr (Esomeprazole magnesium) .... Take 1 capsule by mouth once a day 4)  Viagra 50  Mg Tabs (Sildenafil citrate) .... As needed as directed 5)  Mucinex Nasal Spray Moisture 0.05 % Soln (Oxymetazoline hcl) .... As needed 6)  Metoprolol Succinate 50 Mg Xr24h-tab (Metoprolol succinate) .... Take one and one half tablet by mouth daily 7)  Celebrex 200 Mg Caps (Celecoxib) .... One by mouth daily90 8)  Glucosamine-chondroitin Caps (Glucosamine-chondroit-vit c-mn) 9)  Tramadol Hcl 50 Mg Tabs (Tramadol hcl) .Marland Kitchen.. 1-2 q 4-6 hours as needed 10)  Hydrocodone-acetaminophen 10-325 Mg Tabs (Hydrocodone-acetaminophen) .... 1/2 to 1 by mouth q  6  hours as needed  for pain.Marland Kitchen 11)  Adderall Xr 15 Mg Xr24h-cap (Amphetamine-dextroamphetamine) .... Take 1 capsule by mouth once a day  Other Orders: Venipuncture (81191) TLB-BMP (Basic Metabolic Panel-BMET) (80048-METABOL) TLB-Lipid Panel (80061-LIPID) TLB-Hepatic/Liver Function Pnl (80076-HEPATIC) TLB-TSH (Thyroid Stimulating Hormone) (84443-TSH)   Orders Added: 1)  Welcome to Medicare, Physical [G0402] 2)  Venipuncture [47829] 3)  TLB-BMP (Basic Metabolic Panel-BMET) [80048-METABOL] 4)  TLB-Lipid Panel [80061-LIPID] 5)  TLB-Hepatic/Liver Function Pnl [80076-HEPATIC] 6)  TLB-TSH (Thyroid Stimulating Hormone) [84443-TSH] 7)  TLB-CBC Platelet - w/Differential [85025-CBCD] 8)  Protime [85610QW] 9)  Est. Patient Level III [56213]  Appended Document: Orders Update     Clinical Lists Changes  Orders: Added new Service order of Specimen Handling (08657) - Signed      Appended Document: emp--will fast//ccm   ANTICOAGULATION RECORD PREVIOUS REGIMEN & LAB RESULTS Anticoagulation Diagnosis:  Atrial  Fibrillation (ICD-427.31) on  04/06/2009 Previous INR Goal Range:  2 - 3 on  04/06/2009 Previous INR:  1.4 on  05/27/2010 Previous Coumadin Dose(mg):  5mg  on  05/18/2009 Previous Regimen:  1 1/2 pills Fri. only all others 7.5mg . on  05/27/2010  NEW REGIMEN & LAB RESULTS Current INR: 2.0 Regimen: same  Repeat testing in: 4  weeks  Anticoagulation Visit Questionnaire Coumadin dose missed/changed:  No Abnormal Bleeding Symptoms:  No  Any diet changes including alcohol intake, vegetables or greens since the last visit:  No Any illnesses or hospitalizations since the last visit:  No Any signs of clotting since the last visit (including chest discomfort, dizziness, shortness of breath, arm tingling, slurred speech, swelling or redness in leg):  No  MEDICATIONS WARFARIN SODIUM 5 MG TABS (WARFARIN SODIUM) by mouth daily as directed LISINOPRIL 40 MG TABS (LISINOPRIL) Take one tablet by mouth daily NEXIUM 40 MG  CPDR (ESOMEPRAZOLE MAGNESIUM) Take 1 capsule by mouth once a day VIAGRA 50 MG  TABS (SILDENAFIL CITRATE) as needed as directed MUCINEX NASAL SPRAY MOISTURE 0.05 % SOLN (OXYMETAZOLINE HCL) as needed METOPROLOL SUCCINATE 50 MG XR24H-TAB (METOPROLOL SUCCINATE) Take one and one half tablet by mouth daily CELEBREX 200 MG CAPS (CELECOXIB) one by mouth daily90 GLUCOSAMINE-CHONDROITIN   CAPS (GLUCOSAMINE-CHONDROIT-VIT C-MN)  TRAMADOL HCL 50 MG TABS (TRAMADOL HCL) 1-2 q 4-6 hours as needed HYDROCODONE-ACETAMINOPHEN 10-325 MG TABS (HYDROCODONE-ACETAMINOPHEN) 1/2 to 1 by mouth q  6  hours as needed  for pain.. ADDERALL XR 15 MG XR24H-CAP (AMPHETAMINE-DEXTROAMPHETAMINE) Take 1 capsule by mouth once a day do not fill until 12/07/10    Laboratory Results   Blood Tests      INR: 2.0   (Normal Range: 0.88-1.12   Therap INR: 2.0-3.5) Comments: Rita Ohara  October 07, 2010 9:12 AM    Flu Vaccine Consent Questions     Do you have a history of severe allergic reactions to this vaccine? no    Any prior history of allergic reactions to egg and/or gelatin? no    Do you have a sensitivity to the preservative Thimersol? no    Do you have a past history of Guillan-Barre Syndrome? no    Do you currently have an acute febrile illness? no    Have you ever had a severe reaction to latex? no    Vaccine information given  and explained to patient? yes    Are you currently pregnant? no    Lot Number:[id number]   Exp Date:06/04/2011   Site Given  Left Deltoid IM  [email or web address]    Immunizations Administered:  Pneumonia Vaccine:    Vaccine Type: Pneumovax (Medicare)    Site: right deltoid    Mfr: Merck    Dose: 0.5 ml    Route: IM    Given by: Alfred Levins, CMA    Exp. Date: 03/30/2012    Lot #: [id number]

## 2011-01-06 NOTE — Progress Notes (Signed)
Summary: Triage   Phone Note Call from Patient Call back at Work Phone [phone]   Caller: Patient Call For: Dr. Arlyce Dice Reason for Call: Talk to Nurse Summary of Call: pt. has prostate cancer and waiting to have treatment--recommended he is seen for diarrhea before the treatment. Requesting sooner appt. than next avail. Initial call taken by: Karna Christmas,  October 19, 2010 9:32 AM  Follow-up for Phone Call        Left message for patient to call back Darcey Nora RN, [facility]  October 19, 2010 9:43 AM  Patient  has some change in bowel habits since his diagnosis of prostate CA.  He is undergoing treatment and was recommended by his urologist to have a colon.  Patient  is scheduled with Willette Cluster RNP on 10/22/10 at 8:30 to discuss. Follow-up by: Darcey Nora RN, CGRN,  October 19, 2010 4:20 PM

## 2011-01-06 NOTE — Assessment & Plan Note (Signed)
Summary: COUGH, CONGESTION // RS   Vital Signs:  Patient profile:   65 year old male Weight:      225 pounds BP sitting:   130 / 88  (right arm) Cuff size:   regular  Vitals Entered By: Duard Brady LPN (November 26, 2010 2:36 PM) CC: cough at night , chest congestion Is Patient Diabetic? No   Primary Care Provider:  Birdie Sons, MD  CC:  cough at night  and chest congestion.  History of Present Illness: 65 year old patient who has chronic atrial ablation on chronic anticoagulation therapy.  He is scheduled to start external beam radiation for prostate cancer in the near future.  For the past week.  He has had increasing chest congestion and productive cough yielding yellow sputum.  There's been no documented fever or chills.  Cough is been refractory and has interfered with sleep.  He has recently resumed.  The anticoagulation therapy  Allergies: 1)  ! Sulfamethoxazole-Trimethoprim (Sulfamethoxazole-Trimethoprim)  Past History:  Past Medical History: Reviewed history from 11/10/2010 and no changes required. 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   left lower lobe pneumonia.  secondary  to above . [facility] spotted fever.  Prostate  Cancer Diverticulosis  Review of Systems       The patient complains of anorexia and prolonged cough.  The patient denies fever, weight loss, weight gain, vision loss, decreased hearing, hoarseness, chest pain, syncope, dyspnea on exertion, peripheral edema, headaches, hemoptysis, abdominal pain, melena, hematochezia, severe indigestion/heartburn, hematuria, incontinence, genital sores, muscle weakness, suspicious skin lesions, transient blindness, difficulty walking, depression, unusual weight change, abnormal bleeding, enlarged lymph nodes, angioedema, breast masses, and testicular masses.    Physical Exam  General:  Well-developed,well-nourished,in no acute distress; alert,appropriate and cooperative throughout examination Head:  Normocephalic and atraumatic without obvious abnormalities. No apparent alopecia or balding. Eyes:  No corneal or conjunctival inflammation noted. EOMI. Perrla. Funduscopic exam benign, without hemorrhages, exudates or papilledema. Vision grossly normal. Ears:  External ear exam shows no significant lesions or deformities.  Otoscopic examination reveals clear canals, tympanic membranes are intact bilaterally without bulging, retraction, inflammation or discharge. Hearing is grossly normal bilaterally. Mouth:  Oral mucosa and oropharynx without lesions or exudates.  Teeth in good repair. Neck:  No deformities, masses, or tenderness noted. Lungs:  rales noted at the right base O2 saturation 97% Heart:  controlled ventricular response Abdomen:  Bowel sounds positive,abdomen soft and non-tender without masses, organomegaly or hernias noted.   Impression & Recommendations:  Problem # 1:  COUGH (ICD-786.2) suspicious for early right lower lobe community acquired pneumonia  Problem # 2:  HYPERTENSION (ICD-401.9)  His updated medication list for this problem includes:    Lisinopril 40 Mg Tabs (Lisinopril) .Marland Kitchen... Take one tablet by mouth daily    Metoprolol Succinate  50 Mg Xr24h-tab (Metoprolol succinate) .Marland Kitchen... Take one and one half tablet by mouth daily  Complete Medication List: 1)  Warfarin Sodium 5 Mg Tabs (Warfarin sodium) .... By mouth daily as directed 2)  Lisinopril 40 Mg Tabs (Lisinopril) .Marland KitchenMarland KitchenMarland Kitchen  Take one tablet by mouth daily 3)  Nexium 40 Mg Cpdr (Esomeprazole magnesium) .... Take 1 capsule by mouth once a day 4)  Viagra 50 Mg Tabs (Sildenafil citrate) .... As needed as directed 5)  Mucinex Nasal Spray Moisture 0.05 % Soln (Oxymetazoline hcl) .... As needed 6)  Metoprolol Succinate 50 Mg Xr24h-tab (Metoprolol succinate) .... Take one and one half tablet by mouth daily 7)  Celebrex 200 Mg Caps (Celecoxib) .... One by mouth daily90 8)  Glucosamine-chondroitin Caps (Glucosamine-chondroit-vit c-mn) 9)  Tramadol Hcl 50 Mg Tabs (Tramadol hcl) .Marland Kitchen.. 1-2 q 4-6 hours as needed 10)  Hydrocodone-acetaminophen 10-325 Mg Tabs (Hydrocodone-acetaminophen) .... 1/2 to 1 by mouth q  6  hours as needed  for pain.Marland Kitchen 11)  Adderall Xr 15 Mg Xr24h-cap (Amphetamine-dextroamphetamine) .... Take 1 capsule by mouth once a day do not fill until 12/07/10 12)  Zofran 50 Mg  .... Take 1 every 4-6 hours as needed for nausea 13)  Zithromax Z-pak 250 Mg Tabs (Azithromycin) .... Two initially, then one daily for 4 days 14)  Hydrocodone-homatropine 5-1.5 Mg/35ml Syrp (Hydrocodone-homatropine) .Marland Kitchen.. 1 teaspoon every 6 hours as needed for cough  Patient Instructions: 1)  Get plenty of rest, drink lots of clear liquids, and use Tylenol or Ibuprofen for fever and comfort. Return in 7-10 days if you're not better:sooner if you're feeling worse. 2)  Take your antibiotic as prescribed until ALL of it is gone, but stop if you develop a rash or swelling and contact our office as soon as possible. Prescriptions: HYDROCODONE-HOMATROPINE 5-1.5 MG/5ML SYRP (HYDROCODONE-HOMATROPINE) 1 teaspoon every 6 hours as needed for cough  #6 oz x 0   Entered and Authorized by:   Gordy Savers  MD   Signed  by:   Gordy Savers  MD on 11/26/2010   Method used:   Print then Give to Patient   RxID:   [id number] Christena Deem Z-PAK 250 MG TABS (AZITHROMYCIN) two initially, then one daily for 4 days  #5 tabs x 0   Entered and Authorized by:   Gordy Savers  MD   Signed by:   Gordy Savers  MD on 11/26/2010   Method used:   Electron[facility] to        [facility]  [address] [phone][facility] (retail)       [address]       [place], [place]  [address]       Ph: [phone] or [phone]       Fax: [phone]   RxID:   [id number] CELEBREX 200 MG CAPS (CELECOXIB) one by mouth daily90  #90 Capsule x 2   Entered and Authorized by:   Gordy Savers  MD   Signed by:   Gordy Savers  MD on 11/26/2010   Method used:   [facility]        [facility] [address] [phone]* (retail)       [address]       [place], [place]  [address]       Ph: [phone] or [phone]       Fax: [phone]   RxID:   [id number]    Orders Added: 1)  Est. Patient Level III [36144]

## 2011-01-06 NOTE — Assessment & Plan Note (Signed)
Summary: pt//ccm pt rsc will come in earlier/njr   Nurse Visit   Allergies: 1)  ! Sulfamethoxazole-Trimethoprim (Sulfamethoxazole-Trimethoprim) Laboratory Results   Blood Tests     PT: 20.4 s   (Normal Range: 10.6-13.4)  INR: 2.8   (Normal Range: 0.88-1.12   Therap INR: 2.0-3.5) Comments: Rita Ohara  February 19, 2010 11:42 AM     Orders Added: 1)  Est. Patient Level I [99211] 2)  Protime [09326ZT]   ANTICOAGULATION RECORD PREVIOUS REGIMEN & LAB RESULTS Anticoagulation Diagnosis:  Atrial Fibrillation (ICD-427.31) on  04/06/2009 Previous INR Goal Range:  2 - 3 on  04/06/2009 Previous INR:  2.0 on  11/11/2009 Previous Coumadin Dose(mg):  5mg  on  05/18/2009 Previous Regimen:  Start 7.5mg  everyday. RTO 2 wks. on  10/28/2009  NEW REGIMEN & LAB RESULTS Current INR: 2.8 Regimen: same  Repeat testing in: 1 month  Anticoagulation Visit Questionnaire Coumadin dose missed/changed:  No Abnormal Bleeding Symptoms:  No  Any diet changes including alcohol intake, vegetables or greens since the last visit:  No Any illnesses or hospitalizations since the last visit:  No Any signs of clotting since the last visit (including chest discomfort, dizziness, shortness of breath, arm tingling, slurred speech, swelling or redness in leg):  No  MEDICATIONS WARFARIN SODIUM 5 MG TABS (WARFARIN SODIUM) by mouth daily as directed LISINOPRIL 40 MG TABS (LISINOPRIL) Take one tablet by mouth daily NEXIUM 40 MG  CPDR (ESOMEPRAZOLE MAGNESIUM) Take 1 capsule by mouth once a day ANDROGEL 50 MG/5GM  GEL (TESTOSTERONE) use 100 mg once daily VIAGRA 50 MG  TABS (SILDENAFIL CITRATE) as needed as directed MUCINEX NASAL SPRAY MOISTURE 0.05 % SOLN (OXYMETAZOLINE HCL) as needed METOPROLOL SUCCINATE 50 MG XR24H-TAB (METOPROLOL SUCCINATE) Take one and one half tablet by mouth daily CELEBREX 200 MG CAPS (CELECOXIB) one by mouth daily90 GLUCOSAMINE-CHONDROITIN   CAPS (GLUCOSAMINE-CHONDROIT-VIT C-MN)    TRAMADOL HCL 50 MG TABS (TRAMADOL HCL) 1-2 q 4-6 hours as needed HYDROCODONE-ACETAMINOPHEN 10-325 MG TABS (HYDROCODONE-ACETAMINOPHEN) 1/2 to 1 by mouth q  6  hours as needed  for pain.. ADDERALL XR 15 MG XR24H-CAP (AMPHETAMINE-DEXTROAMPHETAMINE) Take 1 capsule by mouth once a day ADDERALL XR 15 MG XR24H-CAP (AMPHETAMINE-DEXTROAMPHETAMINE) Take 1 capsule by mouth once a day --FILL IN ONE MONTH ADDERALL XR 15 MG XR24H-CAP (AMPHETAMINE-DEXTROAMPHETAMINE) Take 1 capsule by mouth once a day --FILL IN TWO MONTHS AMOXICILLIN-POT CLAVULANATE 875-125 MG TABS (AMOXICILLIN-POT CLAVULANATE) one by mouth two times a day for 10 days.

## 2011-01-06 NOTE — Progress Notes (Signed)
Summary: Pt req to get colonoscopy asap  Phone Note Call from Patient Call back at Home Phone [phone]   Caller: Patient Summary of Call: Pt is req to get order for colonoscopy asap. Pt is suppose to be start radiation treatment and wants to get the colonoscopy asap.    Initial call taken by: Lucy Antigua,  October 18, 2010 8:47 AM  Follow-up for Phone Call        order placed Follow-up by: Alfred Levins, CMA,  October 18, 2010 9:43 AM

## 2011-01-06 NOTE — Assessment & Plan Note (Signed)
Summary: UPPER RESP INF/COUGH/CJR   Vital Signs:  Patient profile:   65 year old male Temp:     98.9 degrees F oral BP sitting:   130 / 90  (left arm) Cuff size:   large  Vitals Entered By: Sid Falcon LPN (January 05, 2010 9:49 AM) CC: URI, low grade fever, cough   History of Present Illness: Acute visit. Upper respiratory infection which started week ago. Started with nasal congestion and cough. Had intubation and general anesthesia with left shoulder surgery last Thursday and feels symptoms have been worse since then. On his own started Cipro last Friday night for productive cough. Possible low grade fever but temperature not taken. Occasional wheezing. Prior history of pneumonia. Takes Coumadin for atrial fibrillation. Recent cardioversion and currently sinus rhythm  Allergies: 1)  ! Sulfamethoxazole-Trimethoprim (Sulfamethoxazole-Trimethoprim)  Past History:  Past Medical History: Last updated: 06/23/2009 Current Problems:  ATRIAL FIBRILLATION (ICD-427.31) cardioversion in MAY  HYPERTENSION (ICD-401.9) CANDIDIASIS, SKIN (ICD-112.3) GERD (ICD-530.81) HYPERLIPIDEMIA (ICD-272.4) PERICARDIAL EFFUSION (ICD-423.9) TOE PAIN (ICD-729.5) URI (ICD-465.9) GROIN PAIN (ICD-789.09) EPIDIDYMITIS (ICD-604.90) COUMADIN THERAPY (ICD-V58.61) ENCOUNTER FOR THERAPEUTIC DRUG MONITORING (ICD-V58.83) HYPERKALEMIA (ICD-276.7) RENAL FAILURE, ACUTE (ICD-584.9) SYSTEMIC INFLAMMATORY RESPONSE SYNDROME UNSPEC (ICD-995.90) PNEUMONIA, ORGANISM UNSPECIFIED (ICD-486) MESENTERIC VENOUS THROMBOSIS (ICD-557.0) DISEASE, NAIL NEC (ICD-703.8) PSA, INCREASED (ICD-790.93) HYPERCOAGULABLE STATE, 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   left lower lobe pneumonia.  secondary  to above . [facility] spotted fever.   Past Surgical History: Last updated: 06/23/2009 colonoscopy-2006   arthroscopic knee  surgeries appendectomy tonsillectomy [place]  August  2009  Review of Systems  The patient denies chest pain, dyspnea on exertion, peripheral edema, prolonged cough, and hemoptysis.    Physical Exam  General:  Well-developed,well-nourished,in no acute distress; alert,appropriate and cooperative throughout examination Ears:  External ear exam shows no significant lesions or deformities.  Otoscopic examination reveals clear canals, tympanic membranes are intact bilaterally without bulging, retraction, inflammation or discharge. Hearing is grossly normal bilaterally. Nose:  External nasal examination shows no deformity or inflammation. Nasal mucosa are pink and moist without lesions or exudates. Neck:  No deformities, masses, or tenderness noted. Lungs:  few scattered faint wheezes. No rales. Heart:  normal rate and regular rhythm.     Impression & Recommendations:  Problem # 1:  ACUTE BRONCHITIS (ICD-466.0)  His updated medication list for this problem includes:    Amoxicillin-pot Clavulanate 875-125 Mg Tabs (Amoxicillin-pot clavulanate) ..... One by mouth two times a day for 10 days.  Complete Medication List: 1)  Warfarin Sodium 5 Mg Tabs (Warfarin sodium) .... By mouth daily as directed 2)  Lisinopril 40 Mg Tabs (Lisinopril) .... Take one tablet by mouth daily 3)  Nexium 40 Mg Cpdr (Esomeprazole magnesium) .... Take 1 capsule by mouth once a day 4)  Androgel 50 Mg/5gm Gel (Testosterone) .... Use 100 mg once daily 5)  Viagra 50 Mg Tabs (Sildenafil citrate) .... As needed as directed 6)  Mucinex Nasal Spray Moisture 0.05 % Soln (Oxymetazoline hcl) .... As needed 7)  Metoprolol Succinate 50 Mg Xr24h-tab (Metoprolol succinate) .... Take one and one half tablet by mouth daily 8)  Celebrex 200 Mg Caps (Celecoxib) .... One by mouth daily90 9)  Glucosamine-chondroitin Caps (Glucosamine-chondroit-vit c-mn) 10)  Tramadol Hcl 50 Mg Tabs (Tramadol hcl) .Marland Kitchen.. 1-2 q 4-6 hours as  needed 11)  Hydrocodone-acetaminophen 10-325 Mg Tabs (Hydrocodone-acetaminophen) .... 1/2 to 1 by mouth q  6  hours as needed  for  pain.. 12)  Adderall Xr 15 Mg Xr24h-cap (Amphetamine-dextroamphetamine) .... Take 1 capsule by mouth once a day 13)  Adderall Xr 15 Mg Xr24h-cap (Amphetamine-dextroamphetamine) .... Take 1 capsule by mouth once a day --fill in one month 14)  Adderall Xr 15 Mg Xr24h-cap (Amphetamine-dextroamphetamine) .... Take 1 capsule by mouth once a day --fill in two months 15)  Amoxicillin-pot Clavulanate 875-125 Mg Tabs (Amoxicillin-pot clavulanate) .... One by mouth two times a day for 10 days.  Patient Instructions: 1)  be sure to followup for Coumadin monitoring in one to 2 weeks 2)  Acute Bronchitis symptoms for less then 10 days are not  helped by antibiotics. Take over the counter cough medications. Call if no improvement in 5-7 days, sooner if increasing cough, fever, or new symptoms ( shortness of breath, chest pain) .  Prescriptions: AMOXICILLIN-POT CLAVULANATE 875-125 MG TABS (AMOXICILLIN-POT CLAVULANATE) one by mouth two times a day for 10 days.  #20 x 0   Entered and Authorized by:   Evelena Peat MD   Signed by:   Evelena Peat MD on 01/05/2010   Method used:   [facility]        [facility]  [address] [phone]* (retail)       [address]       [place], [place]  [address]       Ph: [phone] or [phone]       Fax: [phone]   RxID:   [id number]

## 2011-01-06 NOTE — Letter (Signed)
Summary: [facility] Instructions  [facility]  [address] [place], [place] [address]   Phone: [phone]  Fax: [phone]       Mathew Randolph    05/16/1946    MRN: [id number]        Procedure Day /Date: 11-08-10     Arrival Time: 8:00 Am      Procedure Time: 9:00 AM     Location of Procedure:                    X     [facility] (4th Floor)   PREPARATION FOR COLONOSCOPY WITH MOVIPREP   Starting 5 days prior to your procedure 11-03-10  do not eat nuts, seeds, popcorn, corn, beans, peas,  salads, or any raw vegetables.  Do not take any fiber supplements (e.g. Metamucil, Citrucel, and Benefiber).  THE DAY BEFORE YOUR PROCEDURE         DATE: 11-07-10  DAY: Sunday  1.  Drink clear liquids the entire day-NO SOLID FOOD  2.  Do not drink anything colored red or purple.  Avoid juices with pulp.  No orange juice.  3.  Drink at least 64 oz. (8 glasses) of fluid/clear liquids during the day to prevent dehydration and help the prep work efficiently.  CLEAR LIQUIDS INCLUDE: Water Jello Ice Popsicles Tea (sugar ok, no milk/cream) Powdered fruit flavored drinks Coffee (sugar ok, no milk/cream) Gatorade Juice: apple, white grape, white cranberry  Lemonade Clear bullion, consomm, broth Carbonated beverages (any kind) Strained chicken noodle soup Hard Candy                             4.  In the morning, mix first dose of MoviPrep solution:    Empty 1 Pouch A and 1 Pouch B into the disposable container    Add lukewarm drinking water to the top line of the container. Mix to dissolve    Refrigerate (mixed solution should be used within 24 hrs)  5.  Begin drinking the prep at 5:00 p.m. The MoviPrep container is divided by 4 marks.   Every 15 minutes drink the solution down to the next mark (approximately 8 oz) until the full liter is complete.   6.  Follow completed prep with 16 oz of clear liquid of your choice (Nothing red or purple).  Continue to  drink clear liquids until bedtime.  7.  Before going to bed, mix second dose of MoviPrep solution:    Empty 1 Pouch A and 1 Pouch B into the disposable container    Add lukewarm drinking water to the top line of the container. Mix to dissolve    Refrigerate  THE DAY OF YOUR PROCEDURE      DATE: 11-08-10  DAY: Monday  Beginning at 4:00 Am  (5 hours before procedure):         1. Every 15 minutes, drink the solution down to the next mark (approx 8 oz) until the full liter is complete.  2. Follow completed prep with 16 oz. of clear liquid of your choice.    3. You may drink clear liquids until 7:00 AM  (2 HOURS BEFORE PROCEDURE).   MEDICATION INSTRUCTIONS  Unless otherwise instructed, you should take regular prescription medications with a small sip of water   as early as possible the morning of your procedure.  We will notify Dr. Birdie Sons you are having a Colonoscopy on  11-08-10 regarding the Coumadin 5 MG.       OTHER INSTRUCTIONS  You will need a responsible adult at least 66 years of age to accompany you and drive you home.   This person must remain in the waiting room during your procedure.  Wear loose fitting clothing that is easily removed.  Leave jewelry and other valuables at home.  However, you may wish to bring a book to read or  an iPod/MP3 player to listen to music as you wait for your procedure to start.  Remove all body piercing jewelry and leave at home.  Total time from sign-in until discharge is approximately 2-3 hours.  You should go home directly after your procedure and rest.  You can resume normal activities the  day after your procedure.  The day of your procedure you should not:   Drive   Make legal decisions   Operate machinery   Drink alcohol   Return to work  You will receive specific instructions about eating, activities and medications before you leave.    The above instructions have been reviewed and explained to me by    _______________________    I fully understand and can verbalize these instructions _____________________________ Date _________

## 2011-01-06 NOTE — Assessment & Plan Note (Signed)
Summary: change in bowel habits, prostate CA/sheri    History of Present Illness Visit Type: Initial Visit Primary GI MD: Melvia Heaps MD [facility] Primary Provider: Birdie Sons, MD Chief Complaint: Recent Dx of Prostate cancer. Wants to discuss having a colonoscopy. The last year pt has a change in his bowels with watery loose diarrhea after meals. No blood in stool. History of Present Illness:   Patient is a 65 year old male seen in 2004 by Dr. Arlyce Dice for CRC screening. He was diagnosed with prostate cancer late September of this year and is due to start radiation treatment. Patient  is here for evaluation of a one year history of diarrhea for which he is taking Immodium on a regular basis. When on Immodium, stools are solid, borderline hard. He has limited amount of nocturnal diarrhea. No significant abdominal pain.  No rectal bleeding.    GI Review of Systems      Denies abdominal pain, acid reflux, belching, bloating, chest pain, dysphagia with liquids, dysphagia with solids, heartburn, loss of appetite, nausea, vomiting, vomiting blood, weight loss, and  weight gain.      Reports change in bowel habits and  diarrhea.     Denies anal fissure, black tarry stools, constipation, diverticulosis, fecal incontinence, heme positive stool, hemorrhoids, irritable bowel syndrome, jaundice, light color stool, liver problems, rectal bleeding, and  rectal pain.    Current Medications (verified): 1)  Warfarin Sodium 5 Mg Tabs (Warfarin Sodium) .... By Mouth Daily As Directed 2)  Lisinopril 40 Mg Tabs (Lisinopril) .... Take One Tablet By Mouth Daily 3)  Nexium 40 Mg  Cpdr (Esomeprazole Magnesium) .... Take 1 Capsule By Mouth Once A Day 4)  Viagra 50 Mg  Tabs (Sildenafil Citrate) .... As Needed As Directed 5)  Mucinex Nasal Spray Moisture 0.05 % Soln (Oxymetazoline Hcl) .... As Needed 6)  Metoprolol Succinate 50 Mg Xr24h-Tab (Metoprolol Succinate) .... Take One and One Half Tablet By Mouth Daily 7)   Celebrex 200 Mg Caps (Celecoxib) .... One By Mouth Daily90 8)  Glucosamine-Chondroitin   Caps (Glucosamine-Chondroit-Vit C-Mn) 9)  Tramadol Hcl 50 Mg Tabs (Tramadol Hcl) .Marland Kitchen.. 1-2 Q 4-6 Hours As Needed 10)  Hydrocodone-Acetaminophen 10-325 Mg Tabs (Hydrocodone-Acetaminophen) .... 1/2 To 1 By Mouth Q  6  Hours As Needed  For Pain.Marland Kitchen 11)  Adderall Xr 15 Mg Xr24h-Cap (Amphetamine-Dextroamphetamine) .... Take 1 Capsule By Mouth Once A Day Do Not Fill Until 12/07/10 12)  Moviprep 100 Gm  Solr (Peg-Kcl-Nacl-Nasulf-Na Asc-C) .... As Per Prep Instructions. 13)  Zofran 50 Mg .... Take 1 Every 4-6 Hours As Needed For Nausea  Allergies (verified): 1)  ! Sulfamethoxazole-Trimethoprim (Sulfamethoxazole-Trimethoprim)  Past History:  Past Medical History: ATRIAL FIBRILLATION (ICD-427.31) cardioversion in MAY  HYPERTENSION (ICD-401.9) CANDIDIASIS, SKIN (ICD-112.3) GERD (ICD-530.81) HYPERLIPIDEMIA (ICD-272.4) PERICARDIAL EFFUSION (ICD-423.9) TOE PAIN (ICD-729.5) URI (ICD-465.9) GROIN PAIN (ICD-789.09) EPIDIDYMITIS (ICD-604.90) COUMADIN THERAPY (ICD-V58.61) ENCOUNTER FOR THERAPEUTIC DRUG MONITORING (ICD-V58.83) HYPERKALEMIA (ICD-276.7) RENAL FAILURE, ACUTE (ICD-584.9) SYSTEMIC INFLAMMATORY RESPONSE SYNDROME UNSPEC (ICD-995.90) PNEUMONIA, ORGANISM UNSPECIFIED (ICD-486) MESENTERIC VENOUS THROMBOSIS (ICD-557.0) DISEASE, NAIL NEC (ICD-703.8) PSA, INCREASED (ICD-790.93) HYPERCOAGULABLE STATE, 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   left lower lobe pneumonia.  secondary  to above . [facility] spotted fever.  Prostate Cancer  Past Surgical History: colonoscopy-2004  arthroscopic knee surgeries appendectomy tonsillectomy [place]  August  2009  Family History: Reviewed history from 08/02/2008 and no changes required. 2 cousins with blood clots--one sounds arterial.  Social History: Reviewed history from  08/02/2008 and no changes required. 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 smoker Occasional alcohol  Review of Systems  The patient denies allergy/sinus, anemia, anxiety-new, arthritis/joint pain, back pain, blood in urine, breast changes/lumps, change in vision, confusion, cough, coughing up blood, depression-new, fainting, fatigue, fever, headaches-new, hearing problems, heart murmur, heart rhythm changes, itching, menstrual pain, muscle pains/cramps, night sweats, nosebleeds, pregnancy symptoms, shortness of breath, skin rash, sleeping problems, sore throat, swelling of feet/legs, swollen lymph glands, thirst - excessive , urination - excessive , urination changes/pain, urine leakage, vision changes, and voice change.    Vital Signs:  Patient profile:   65 year old male Height:      70 inches Weight:      220.25 pounds Pulse rate:   88 / minute Pulse rhythm:   regular BP sitting:   128 / 74  (left arm) Cuff size:   regular  Vitals Entered By: Christie Nottingham CMA Duncan Dull) (October 22, 2010 8:53 AM)  Physical Exam  General:  Well developed, well nourished, no acute distress. Head:  Normocephalic and atraumatic. Eyes:  Conjunctiva pink, no icterus.  Neck:  no obvious masses  Lungs:  Clear throughout to auscultation. Heart:  Regular rate and rhythm; no murmurs, rubs,  or bruits. Abdomen:  Abdomen soft, nontender, nondistended. No obvious masses or hepatomegaly.Normal bowel sounds.  Msk:  no obvious masses  Neurologic:  Alert and  oriented x4;  grossly normal neurologically. Skin:  Intact without significant lesions or rashes. Cervical Nodes:  No significant cervical adenopathy. Psych:  Alert and cooperative. Normal mood and affect.   Impression & Recommendations:  Problem # 1:  DIARRHEA (ICD-787.91) Assessment New One year history of diarrhea described as 4-5 loose BMs a day. Patient to start radiation therapy soon for newly diagnosed prostate cancer. Last  colonscopy 2004, done for screening. Given chronicity, diarrhea not likely infectious but will check some stool studies. In addition, the patient will be scheduled for a colonoscopy with biopsies/polypectomy (if indicated).  The risks and benefits of the procedure, as well as alternatives were discussed with the patient and he agrees to proceed. PCP will be contacted about holding Coumadin for procedure.   Orders: T-Culture, Stool ([id number]) T-Stool for O&P ([id number]) Colonoscopy (Colon)  Problem # 2:  HYPERCOAGULABLE STATE, PRIMARY (ICD-289.81) Assessment: Comment Only On chronic Coumadin.  Problem # 3:  ADENOCARCINOMA, PROSTATE (ICD-185) Assessment: Comment Only Patient will be starting radiation treatments in near future.  Problem # 4:  ATRIAL FIBRILLATION (ICD-427.31) Assessment: Comment Only On Coumadin for A-Fib and hypercoagulable state.  Patient Instructions: 1)  Please go to lab, basement level. 2)  We have scheduled the Colonoscopy with Dr. Melvia Heaps on 11-08-10. 3)  Directions and brochure provided. 4)  [facility] Patient Information Guide given to patient. 5)  We sent a letter to Dr. Birdie Sons informing him of the procedure regarding the Coumadin 5 mg. We will call you once he responds to our letter. 6)  Copy sent to : Dr. Birdie Sons 7)  The medication list was reviewed and reconciled.  All changed / newly prescribed medications were explained.  A complete medication list was provided to the patient / caregiver. Prescriptions: MOVIPREP 100 GM  SOLR (PEG-KCL-NACL-NASULF-NA ASC-C) As per prep instructions.  #1 x 0   Entered by:   Lowry Ram NCMA   Authorized by:   Willette Cluster NP   Signed by:   Lowry Ram NCMA on 10/22/2010   Method used:  [facility]  [address] [phone][facility] (retail)       [address]       [place], [place]  [address]       Ph: [phone] or [phone]       Fax: [phone]   RxID:    [id number]

## 2011-01-06 NOTE — Procedures (Signed)
Summary: Colonoscopy  Patient: Mathew Randolph Note: All result statuses are Final unless otherwise noted.  Tests: (1) Colonoscopy (COL)   COL Colonoscopy           DONE     [facility]     [address]     [place], [place]  [address]           COLONOSCOPY PROCEDURE REPORT           PATIENT:  Mathew Randolph, Mathew Randolph  MR#:  [id number]     BIRTHDATE:  [birth year], 64 yrs. old  GENDER:  male           ENDOSCOPIST:  Barbette Hair. Arlyce Dice, MD     Referred by:           PROCEDURE DATE:  11/08/2010     PROCEDURE:  Colonoscopy with biopsy     ASA CLASS:  Class II     INDICATIONS:  1) unexplained diarrhea           MEDICATIONS:   Fentanyl 50 mcg IV, Versed 5 mg IV           DESCRIPTION OF PROCEDURE:   After the risks benefits and     alternatives of the procedure were thoroughly explained, informed     consent was obtained.  Digital rectal exam was performed and     revealed no abnormalities.   The LB 180AL [id number] endoscope was     introduced through the anus and advanced to the cecum, which was     identified by both the appendix and ileocecal valve, without     limitations.  The quality of the prep was excellent, using     MoviPrep.  The instrument was then slowly withdrawn as the colon     was fully examined.     <<PROCEDUREIMAGES>>           FINDINGS:  other finding in the descending colon. Discreet area of     prominent but soft folds. Bxs taken (see image7).  Moderate     diverticulosis was found in the descending colon (see image6).     This was otherwise a normal examination of the colon (see image2,     image4, image5, and image11). Random biopsies were taken to r/o     microscopic colitis   Retroflexed views in the rectum revealed no     abnormalities.    The time to cecum =  2.50  minutes. The scope     was then withdrawn (time =  9.25  min) from the patient and the     procedure completed.           COMPLICATIONS:  None           ENDOSCOPIC IMPRESSION:     1) Other  finding in the descending colon - enlarged folds     2) Moderate diverticulosis in the descending colon     3) Otherwise normal examination     RECOMMENDATIONS:     1) Await biopsy results     2) call office next 1-3 days to schedule followup visit in           REPEAT EXAM:   You will receive a letter from Dr. Arlyce Dice in 1-2     weeks, after reviewing the final pathology, with followup     recommendations.           ______________________________     Barbette Hair Arlyce Dice, MD  CC: Lindley Magnus, MD           n.     Rosalie DoctorBarbette Hair. Kaplan at 11/08/2010 09:38 AM           Tiana Loft, [id number]  Note: An exclamation mark (!) indicates a result that was not dispersed into the flowsheet. Document Creation Date: 11/08/2010 9:38 AM _______________________________________________________________________  (1) Order result status: Final Collection or observation date-time: 11/08/2010 09:29 Requested date-time:  Receipt date-time:  Reported date-time:  Referring Physician:   Ordering Physician: Melvia Heaps [phone]) Specimen Source:  Source: Launa Grill Order Number: [id number] Lab site:
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
## 2011-01-06 NOTE — Progress Notes (Signed)
Summary: wants call 10-24  Phone Note Call from Patient Call back at Home Phone [phone]   Caller: vm Summary of Call: Needs to go off coumadin 5 days prior to implants for prostate cancer.  Also need CPX Medicare 65 Nov.   Left message we returned call.  Rudy Jew, RN  September 27, 2010 10:43 AM  Initial call taken by: Rudy Jew, RN,  September 27, 2010 9:44 AM  Follow-up for Phone Call        pt aware, made appt for cpx Follow-up by: Alfred Levins, CMA,  September 27, 2010 10:55 AM

## 2011-01-06 NOTE — Letter (Signed)
Summary: Anticoagulation Modification Letter  [facility]  [address] [place], [place] [address]   Phone: [phone]  Fax: [phone]    October 22, 2010  Re:    Mathew Randolph DOB:    [birth year] MRN:    [id number]    Dear Dr. Smitty Cords Swords:  We have scheduled the above patient for an endoscopic procedure. Our records show that  he is on anticoagulation therapy. Please advise as to how long the patient may come off their therapy of Coumadin 5 MG  prior to the scheduled Colonoscopy  on 11-08-2010.   Please fax back/or route the completed form to [facility] CMA at [phone].  Thank you for your help with this matter.  Sincerely,     Dr. Melvia Heaps   Physician Recommendation:  Hold Plavix 7 days prior ________________  Hold Coumadin 5 days prior ____________  Other ______________________________     Appended Document: Anticoagulation Modification Letter LM on pt's home ans machine to advise him we got our response back from Dr. Birdie Sons regarding the Coumadin.  He is to stop taking it on 11-03-10 through 11-08-10 and he can resume taking it on 11-09-10.  I told him to call me if he has any questions at [phone].

## 2011-01-06 NOTE — Progress Notes (Signed)
Summary: nexium refill  Phone Note Refill Request Message from:  Fax from Pharmacy on Apr 22, 2010 10:54 AM  Refills Requested: Medication #1:  NEXIUM 40 MG  CPDR Take 1 capsule by mouth once a day Initial call taken by: Kern Reap CMA Duncan Dull),  Apr 22, 2010 10:54 AM    Prescriptions: NEXIUM 40 MG  CPDR (ESOMEPRAZOLE MAGNESIUM) Take 1 capsule by mouth once a day  #90 x 1   Entered by:   Kern Reap CMA (AAMA)   Authorized by:   Birdie Sons MD   Signed by:   Kern Reap CMA (AAMA) on 04/22/2010   Method used:   Electronically to        [facility]* (mail-order)             ,          Ph: [phone]       Fax: [phone]   RxID:   [id number]

## 2011-01-06 NOTE — Letter (Signed)
Summary: GI Anti-Coagulation Letter  GI Anti-Coagulation Letter   Imported By: Maryln Gottron 10/27/2010 09:49:50  _____________________________________________________________________  External Attachment:    Type:   Image     Comment:   External Document

## 2011-01-06 NOTE — Progress Notes (Signed)
Summary: new rx  Phone Note Refill Request Call back at Home Phone [phone]   Refills Requested: Medication #1:  ADDERALL XR 15 MG XR24H-CAP Take 1 capsule by mouth once a day please call pt when ready for pick up  Initial call taken by: Heron Sabins,  August 27, 2010 8:35 AM  Follow-up for Phone Call        ok Follow-up by: Birdie Sons MD,  August 27, 2010 2:00 PM  Additional Follow-up for Phone Call Additional follow up Details #1::        Pt aware that rx was ready to pick up. Additional Follow-up by: Romualdo Bolk, CMA (AAMA),  August 27, 2010 2:20 PM    Prescriptions: ADDERALL XR 15 MG XR24H-CAP (AMPHETAMINE-DEXTROAMPHETAMINE) Take 1 capsule by mouth once a day  #30 x 0   Entered by:   Romualdo Bolk, CMA (AAMA)   Authorized by:   Birdie Sons MD   Signed by:   Romualdo Bolk, CMA (AAMA) on 08/27/2010   Method used:   Print then Give to Patient   RxID:   [id number]

## 2011-01-06 NOTE — Letter (Signed)
Summary: [facility]  [facility]   Imported By: Maryln Gottron 08/31/2010 15:11:15  _____________________________________________________________________  External Attachment:    Type:   Image     Comment:   External Document

## 2011-01-06 NOTE — Letter (Signed)
Summary: Results Letter   Gastroenterology  [address] [place], [place] [address]   Phone: [phone]  Fax: [phone]        November 10, 2010 MRN: [id number]    Mathew Randolph [address] [place], [place]  [address]    Dear Mathew Randolph,  Your biopsy results did not show any remarkable findings.  Please continue with the recommendations previously discussed.  Should you have any further questions or immediate concers, feel free to contact me.  Sincerely,  Barbette Hair. Arlyce Dice, M.D., [facility]          Sincerely,  Louis Meckel MD  This letter has been electronically signed by your physician.  Appended Document: Results Letter Letter mailed

## 2011-01-06 NOTE — Consult Note (Signed)
Summary: [facility]  [facility]   Imported By: Maryln Gottron 12/17/2009 14:25:24  _____________________________________________________________________  External Attachment:    Type:   Image     Comment:   External Document

## 2011-01-06 NOTE — Letter (Signed)
Summary: [facility]  [facility]   Imported By: Maryln Gottron 10/15/2010 14:45:04  _____________________________________________________________________  External Attachment:    Type:   Image     Comment:   External Document

## 2011-01-06 NOTE — Progress Notes (Signed)
Summary: Pt wants to know if scripts can be mailed to his home  Phone Note Call from Patient Call back at Home Phone [phone]   Caller: Patient Summary of Call: Pt called and is wanting to know if his scripts for Adderall can be mailed to his home.    Initial call taken by: Lucy Antigua,  May 10, 2010 1:55 PM  Follow-up for Phone Call        no, must pick up and sign for Follow-up by: Raechel Ache, RN,  May 10, 2010 2:51 PM

## 2011-01-06 NOTE — Progress Notes (Signed)
Summary: Stool card results   Phone Note Call from Patient Call back at Home Phone [phone]   Call For: Dr Arlyce Dice Reason for Call: Lab or Test Results Summary of Call: Stool cultures results Initial call taken by: Leanor Kail [facility],  November 10, 2010 3:47 PM  Follow-up for Phone Call        Spoke with patient, he wanted to know the results of his stool cultures, informed patient that these were normal. Paient also requests a phone call with path report from colon done 11/08/10. Will call patient when available. Selinda Michaels RN  November 10, 2010 4:06 PM  Left message to call back Selinda Michaels RN  November 11, 2010 10:02 AM  patient returned a call he was notified of path results.  He is asked to call back for any further questions or concerns. Follow-up by: Darcey Nora RN, CGRN,  November 11, 2010 10:49 AM

## 2011-01-10 ENCOUNTER — Other Ambulatory Visit: Payer: Self-pay | Admitting: Urology

## 2011-01-10 ENCOUNTER — Inpatient Hospital Stay (HOSPITAL_BASED_OUTPATIENT_CLINIC_OR_DEPARTMENT_OTHER)
Admission: RE | Admit: 2011-01-10 | Discharge: 2011-01-10 | Disposition: A | Discharge status: Home / Self Care | Payer: Medicare Other | Source: Ambulatory Visit

## 2011-01-10 ENCOUNTER — Ambulatory Visit
Admission: RE | Admit: 2011-01-10 | Discharge: 2011-01-10 | Disposition: A | Discharge status: Home / Self Care | Payer: Medicare Other | Source: Ambulatory Visit | Attending: Urology | Admitting: Urology

## 2011-01-10 DIAGNOSIS — C61 Malignant neoplasm of prostate: Secondary | ICD-10-CM

## 2011-01-14 ENCOUNTER — Telehealth: Payer: Self-pay | Admitting: Internal Medicine

## 2011-01-14 DIAGNOSIS — F909 Attention-deficit hyperactivity disorder, unspecified type: Secondary | ICD-10-CM

## 2011-01-14 NOTE — Telephone Encounter (Signed)
Pt needs new rx adderall xr 15 mg

## 2011-01-14 NOTE — Telephone Encounter (Signed)
ok 

## 2011-01-17 ENCOUNTER — Other Ambulatory Visit: Payer: Self-pay | Admitting: Internal Medicine

## 2011-01-17 MED ORDER — AMPHETAMINE-DEXTROAMPHET ER 15 MG PO CP24: 15.0000 mg | ORAL_CAPSULE | Freq: Every day | ORAL | Status: DC

## 2011-01-17 NOTE — Telephone Encounter (Signed)
Gave 3 mth supply

## 2011-01-18 ENCOUNTER — Telehealth: Payer: Self-pay | Admitting: Internal Medicine

## 2011-01-18 DIAGNOSIS — K219 Gastro-esophageal reflux disease without esophagitis: Secondary | ICD-10-CM

## 2011-01-18 MED ORDER — ESOMEPRAZOLE MAGNESIUM 40 MG PO CPDR: 40.0000 mg | DELAYED_RELEASE_CAPSULE | Freq: Every day | ORAL | Status: DC

## 2011-01-18 NOTE — Telephone Encounter (Signed)
[facility] needs permission for pt to stop Coumedin, prior to surgery on March 9th.   Pls fax to [phone] attn Pam.

## 2011-01-19 ENCOUNTER — Encounter: Payer: Self-pay | Admitting: *Deleted

## 2011-01-19 ENCOUNTER — Telehealth: Payer: Self-pay | Admitting: *Deleted

## 2011-01-19 NOTE — Telephone Encounter (Signed)
Letter sent to [facility] to stop Coumadin 5 days prior.

## 2011-01-19 NOTE — Telephone Encounter (Signed)
Ok for 5 days prior

## 2011-01-19 NOTE — Telephone Encounter (Signed)
Encounter opened by mistake

## 2011-01-31 ENCOUNTER — Other Ambulatory Visit: Payer: Self-pay

## 2011-02-02 ENCOUNTER — Other Ambulatory Visit: Payer: Self-pay | Admitting: Dermatology

## 2011-02-07 ENCOUNTER — Ambulatory Visit: Payer: Self-pay | Admitting: Internal Medicine

## 2011-02-07 LAB — CBC
HCT: 44.2 % (ref 39.0–52.0)
Hemoglobin: 14.8 g/dL (ref 13.0–17.0)
MCH: 31 pg (ref 26.0–34.0)
MCHC: 33.5 g/dL (ref 30.0–36.0)
MCV: 92.7 fL (ref 78.0–100.0)
Platelets: 220 10*3/uL (ref 150–400)
RBC: 4.77 MIL/uL (ref 4.22–5.81)
RDW: 13.6 % (ref 11.5–15.5)
WBC: 7.7 10*3/uL (ref 4.0–10.5)

## 2011-02-07 LAB — COMPREHENSIVE METABOLIC PANEL
ALT: 36 U/L (ref 0–53)
AST: 29 U/L (ref 0–37)
Albumin: 3.9 g/dL (ref 3.5–5.2)
Alkaline Phosphatase: 69 U/L (ref 39–117)
BUN: 16 mg/dL (ref 6–23)
CO2: 27 mEq/L (ref 19–32)
Calcium: 9.2 mg/dL (ref 8.4–10.5)
Chloride: 105 mEq/L (ref 96–112)
Creatinine, Ser: 1.1 mg/dL (ref 0.4–1.5)
GFR calc Af Amer: >60 mL/min (ref >60)
GFR calc non Af Amer: >60 mL/min (ref >60)
Glucose, Bld: 91 mg/dL (ref 70–99)
Potassium: 4.6 mEq/L (ref 3.5–5.1)
Sodium: 139 mEq/L (ref 135–145)
Total Bilirubin: 1 mg/dL (ref 0.3–1.2)
Total Protein: 6.7 g/dL (ref 6.0–8.3)

## 2011-02-07 LAB — APTT: aPTT: 31 seconds (ref 24–37)

## 2011-02-07 LAB — PROTIME-INR
INR: 1.43 (ref 0.00–1.49)
Prothrombin Time: 17.6 seconds — ABNORMAL HIGH (ref 11.6–15.2)

## 2011-02-08 ENCOUNTER — Telehealth (INDEPENDENT_AMBULATORY_CARE_PROVIDER_SITE_OTHER): Payer: Self-pay | Admitting: *Deleted

## 2011-02-11 ENCOUNTER — Ambulatory Visit (HOSPITAL_BASED_OUTPATIENT_CLINIC_OR_DEPARTMENT_OTHER)
Admission: RE | Admit: 2011-02-11 | Discharge: 2011-02-11 | Disposition: A | Discharge status: Home / Self Care | Payer: Medicare Other | Source: Ambulatory Visit | Attending: Urology | Admitting: Urology

## 2011-02-11 ENCOUNTER — Ambulatory Visit (HOSPITAL_COMMUNITY): Payer: Medicare Other

## 2011-02-11 DIAGNOSIS — C61 Malignant neoplasm of prostate: Secondary | ICD-10-CM | POA: Insufficient documentation

## 2011-02-11 DIAGNOSIS — Z7901 Long term (current) use of anticoagulants: Secondary | ICD-10-CM | POA: Insufficient documentation

## 2011-02-11 DIAGNOSIS — R9431 Abnormal electrocardiogram [ECG] [EKG]: Secondary | ICD-10-CM | POA: Insufficient documentation

## 2011-02-11 DIAGNOSIS — I10 Essential (primary) hypertension: Secondary | ICD-10-CM | POA: Insufficient documentation

## 2011-02-11 DIAGNOSIS — I4891 Unspecified atrial fibrillation: Secondary | ICD-10-CM | POA: Insufficient documentation

## 2011-02-11 DIAGNOSIS — Z79899 Other long term (current) drug therapy: Secondary | ICD-10-CM | POA: Insufficient documentation

## 2011-02-11 DIAGNOSIS — Z01810 Encounter for preprocedural cardiovascular examination: Secondary | ICD-10-CM | POA: Insufficient documentation

## 2011-02-11 DIAGNOSIS — Z01812 Encounter for preprocedural laboratory examination: Secondary | ICD-10-CM | POA: Insufficient documentation

## 2011-02-11 DIAGNOSIS — N323 Diverticulum of bladder: Secondary | ICD-10-CM | POA: Insufficient documentation

## 2011-02-11 HISTORY — PX: RADIOACTIVE SEED IMPLANT: SHX5150

## 2011-02-15 NOTE — Op Note (Signed)
Mathew Randolph, Mathew Randolph               ACCOUNT NO.:  [id number]  MEDICAL RECORD NO.:  [id number]           PATIENT TYPE:  LOCATION:                                 FACILITY:  PHYSICIAN:  Ashleah Valtierra C. Vernie Ammons, M.D.  DATE OF BIRTH:  18-May-1946  DATE OF PROCEDURE:  02/11/2011 DATE OF DISCHARGE:                              OPERATIVE REPORT   PREOPERATIVE DIAGNOSIS:  Adenocarcinoma of the prostate.  POSTOPERATIVE DIAGNOSIS:  Adenocarcinoma of the prostate.  PROCEDURE:  I-125 seed implant.  SURGEON:  Kerwin Augustus C. Vernie Ammons, M.D.  RADIATION ONCOLOGIST:  Artist Pais. Kathrynn Running, M.D.  ANESTHESIA:  General.  COUNTS:  Number of needles 20.  Number of seeds 71.  DRAINS:  A 16-French Foley catheter.  BLOOD LOSS:  Minimal.  COMPLICATIONS:  None.  INDICATIONS:  The patient is a 65 year old male who was found to have an elevated PSA of 9.65.  He eventually underwent prostate biopsy which revealed Gleason 3+4 equals 7 adenocarcinoma in 8/12 cores.  He elected to proceed with radiation therapy after fully considering all treatment options.  He has undergone previous external beam radiation and presents for radioactive seed boost.  The risks, complications, alternatives and limitations have been discussed.  He understands and has elected to proceed.  DESCRIPTION OF OPERATION:  After informed consent, the patient was brought to the major OR, placed on the table, administered general anesthesia, then moved the dorsal lithotomy position after which his perineum and genitalia were sterilely prepped and draped.  An official time-out was then performed.  Initially, a 16-French Foley catheter was placed in the bladder and filled with dilute contrast and the transrectal ultrasound probe as well as rectal tube were placed in the rectum and the ultrasound probe was affixed to the stand.  Real-time ultrasonography was used with a spot probe version 3.1 software in order to devise a treatment plan which was then  used to guide location and number of seeds for the procedure.  After the treatment plan had been completed, I proceeded with placement of the seeds with the use of real-time ultrasonography and the Nucletron.  This proceeded without complication or difficulty.  After all seeds had been implanted, the transrectal ultrasound probe as well as rectal tube were removed.  Fluoroscopic images were obtained and the catheter was then removed and cystoscopy performed.  17-French flexible cystoscopy was performed and as I passed the scope down the urethra, it was noted be normal.  The prostatic urethra revealed some slight bilobar hypertrophy.  Upon entering the bladder, I noted 2+ trabeculation.  Ureteral orifices were of normal configuration and position.  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  The bladder was fully inspected and no tumor, stones or inflammatory lesions were noted.  No seeds were seen on the floor of the bladder nor were there any seeds seen protruding from the base of the prostate with retroflexion of the scope.  I, therefore, removed the cystoscope, reinserted a new Foley catheter and connected this to close system drainage.  The  patient was then awakened and taken to recovery room in stable and satisfactory condition.  He was given written instructions and date for followup.  His catheter will be removed in the morning and he was given a prescription for 24 Vicodin HP, 6 Cipro 250 mg b.i.d. and tamsulosin 0.4 mg be taken nightly #30.     Yasaman Kolek C. Vernie Ammons, M.D.     MCO/MEDQ  D:  02/11/2011  T:  02/11/2011  Job:  [id number]  cc:   Artist Pais Kathrynn Running, M.D. Fax: [phone]  Electronically Signed by Ihor Gully M.D. on 02/15/2011 08:46:23 AM

## 2011-02-15 NOTE — Progress Notes (Signed)
Summary: Records Request   Faxed OV & Echo to [facility] [phone]).  Debby Freiberg  February 08, 2011 11:02 AM

## 2011-02-25 ENCOUNTER — Other Ambulatory Visit: Payer: Self-pay | Admitting: Internal Medicine

## 2011-03-03 ENCOUNTER — Ambulatory Visit: Payer: Medicare Other | Attending: Radiation Oncology | Admitting: Radiation Oncology

## 2011-03-04 ENCOUNTER — Ambulatory Visit: Payer: Medicare Other | Attending: Radiation Oncology | Admitting: Radiation Oncology

## 2011-03-04 DIAGNOSIS — C61 Malignant neoplasm of prostate: Secondary | ICD-10-CM | POA: Insufficient documentation

## 2011-03-14 ENCOUNTER — Other Ambulatory Visit: Payer: Self-pay

## 2011-03-22 ENCOUNTER — Ambulatory Visit: Payer: Self-pay | Admitting: Internal Medicine

## 2011-03-22 LAB — CBC
HCT: 50.8 % (ref 39.0–52.0)
Hemoglobin: 17.2 g/dL — ABNORMAL HIGH (ref 13.0–17.0)
MCHC: 33.8 g/dL (ref 30.0–36.0)
MCV: 81.5 fL (ref 78.0–100.0)
Platelets: 248 10*3/uL (ref 150–400)
RBC: 6.24 MIL/uL — ABNORMAL HIGH (ref 4.22–5.81)
RDW: 15.1 % (ref 11.5–15.5)
WBC: 7.5 10*3/uL (ref 4.0–10.5)

## 2011-03-22 LAB — PROTIME-INR: Prothrombin Time: 27.7 seconds — ABNORMAL HIGH (ref 11.6–15.2)

## 2011-03-22 LAB — BASIC METABOLIC PANEL
BUN: 12 mg/dL (ref 6–23)
CO2: 23 mEq/L (ref 19–32)
Chloride: 107 mEq/L (ref 96–112)
Creatinine, Ser: 1.04 mg/dL (ref 0.4–1.5)
Potassium: 4.2 mEq/L (ref 3.5–5.1)

## 2011-03-25 ENCOUNTER — Other Ambulatory Visit: Payer: Self-pay | Admitting: Internal Medicine
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
## 2011-04-19 NOTE — Assessment & Plan Note (Signed)
[facility]   NAME:  Mathew Randolph, Mathew Randolph               ACCOUNT NO.:  [id number]   MEDICAL RECORD NO.:  [id number]      DATE OF BIRTH:  [birth year]   PHYSICIAN:  Madolyn Frieze. Jens Som, MD, [facility] VISIT DATE:  01/20/2009                                   OFFICE VISIT   This is cardioversion of atrial fibrillation.  The patient was sedated  with Pentothal 175 mg intravenously by Anesthesia.  Synchronized  cardioversion with 150 J (biphasic) resulted in normal sinus rhythm.  There were no immediate complications.  We would recommend continuing  Coumadin.      Madolyn Frieze Jens Som, MD, [facility]  Electronically Signed     BSC/MEDQ  D:  01/20/2009  T:  01/21/2009  Job:  [id number]
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
## 2011-04-21 ENCOUNTER — Other Ambulatory Visit: Payer: Self-pay | Admitting: Internal Medicine

## 2011-04-22 NOTE — H&P (Signed)
Mathew Randolph, Mathew Randolph               ACCOUNT NO.:  [id number]   MEDICAL RECORD NO.:  [id number]          PATIENT TYPE:  INP   LOCATION:  1844                         FACILITY:  [facility]   PHYSICIAN:  Bruce Rexene Edison. Swords, M.D. [facility] OF BIRTH:  10/28/1946   DATE OF ADMISSION:  04/28/2006  DATE OF DISCHARGE:                                HISTORY & PHYSICAL   CHIEF COMPLAINT:  Fever.   HISTORY OF PRESENT ILLNESS:  Mathew Randolph is a 65 year old male generally  healthy who has a 10-day history of an illness.  He states 10 days ago he  had sudden onset fever associated with myalgias.  He was in [place], saw  an Urgent Care Center at that time, thought to have some viral infection and  no specific treatment was done at that time.  Approximately 7 days ago  fevers were persistent, had a temperature of 100.4, went to an Urgent Care  Center.  There was discussion about [facility] spotted fever and an  abnormal urine and was treated with doxycycline and Cipro.  Since that time  he has essentially been seen at Urgent Care Center daily.  He has had  persistent fever since that time without any significant localizing symptoms  by his report.  He specifically denies any dysuria, frequency, abdominal  pain, rashes.  Apparently he was seen in the emergency department he states  6 days ago and was diagnosed with prostatitis, Cipro and doxycycline was  changed to Augmentin.  He went back to an Urgent Care Center and antibiotics  were then changed to Cipro and Flagyl and a CT scan was obtained.  CT scan  was done today and apparently demonstrates mesenteric vein thrombosis.   PAST MEDICAL HISTORY:  Past medical history significant for hypertension,  GERD, decreased testosterone, and ADD.   MEDICATIONS:  DynaCirc, atenolol, hydrochlorothiazide, Nexium, AndroGel,  Adderall - he does not know the dose of any of these.   SOCIAL HISTORY:  He is married, he is an ex-smoker, he drinks red wine  occasionally.   He works for [employer].   FAMILY HISTORY:  No history of blood clots, coronary disease, stroke.  It is  worth noting his father had polycythemia.   PAST SURGICAL HISTORY:  Past surgical history includes appendectomy,  tonsillectomy and adenoidectomy, knee surgery x3 and nasal surgery.   REVIEW OF SYSTEMS:  He denies any chest pain, shortness of breath, PND,  orthopnea, abdominal pain, change in bowel movements or nausea or vomiting,  he denies any rashes, neurologic deficits, he denies any other complaints in  a complete review of systems.   PHYSICAL EXAMINATION:  VITAL SIGNS:  Temperature 101.2, heart rate 81, blood  pressure 177/100, respirations 24.  Pulse oximetry 96%.  GENERAL:  He appears as a well-developed, well-nourished male.  He appears  mildly sick but not toxic.  HEENT:  Atraumatic, normocephalic.  Extraocular muscles are intact.  There  is no icterus.  Conjunctivae are normal.  Oropharynx is moist.  NECK:  Supple without lymphadenopathy, thyromegaly, jugular venous  distention, or carotid bruits.  CHEST:  Clear to auscultation  without any increased work of breathing.  CARDIAC:  S1 and S2 are normal without murmurs or gallops.  PMI is normal.  ABDOMEN:  Bowel sounds are active.  Abdomen is soft, nondistended, no  masses.  Hemoccult negative.  EXTREMITIES:  There is no clubbing, cyanosis, or edema.  NEUROLOGIC:  He is alert and oriented without any motor or sensory deficits.   LABORATORIES:  Laboratories are ordered.  Reviewed laboratories from Urgent  Care Center.  White count mildly elevated at 13.5.  LFTs mildly elevated  with an alkaline phosphatase 163, AST 51, ALT 79.   CT scan of the abdomen and pelvis was reviewed with Dr. Reche Dixon, demonstrates  interior mesenteric vein thrombosis, extends to the splenoportal junction.  No other significant findings.  There is some edema around the mesenteric  vein.   ASSESSMENT AND PLAN:  1.  Inferior mesenteric vein thrombosis.   2.  Fever.  3.  Review of laboratories demonstrated hyponatremia and elevated liver      function tests.   Discussed case with Dr. Reche Dixon and Dr. Lina Sar.  Reviewed mesenteric  vein summary and up-to-date.  Treatment of choice at this time is  anticoagulation.  The patient needs evaluation for hypercoagulable states.  We will obtain CBC, CMET, prothrombin time, APTT.  We will order a  hypercoagulable profile.  He may eventually need some thrombophilia  genotyping.   In regards to hyponatremia, we will start intravenous fluids.   In regards to fever, we will start empiric antibiotics with broad-spectrum  Zosyn.   Possibility of occult malignancy in the pancreas needs to be evaluated and  we will obtain an MR scan of the pancreas.   Any worsening symptoms we will ask surgery to see.  He currently has no  signs of peritonitis.      Bruce Rexene Edison Swords, M.D. [facility]  Electronically Signed     BHS/MEDQ  D:  04/28/2006  T:  04/28/2006  Job:  [id number]   cc:   Rosalyn Gess. Norins, M.D. [facility]  [address]  [place]  [place] [address]

## 2011-04-22 NOTE — Op Note (Signed)
Mathew Randolph, Mathew Randolph               ACCOUNT NO.:  [id number]   MEDICAL RECORD NO.:  [id number]          PATIENT TYPE:  OIB   LOCATION:  0098                         [facility]ACILITY[facility]   PHYSICIAN:  Mark C. Vernie Ammons, M.D.  DATE OF BIRTH:  07/07/1946   DATE OF PROCEDURE:  08/14/2006  DATE OF DISCHARGE:  08/15/2006                                 OPERATIVE REPORT   PREOPERATIVE DIAGNOSIS:  Right kidney stone.   POSTOPERATIVE DIAGNOSIS:  Right kidney stone.   PROCEDURES PERFORMED:  Right percutaneous nephrolithotomy (2cm).   ESTIMATED BLOOD LOSS:  Minimal.   COMPLICATIONS:  None.   SURGEON:  Dr. Vernie Ammons.   ASSISTANT:  1. Dr. Marcia Brash.  2. Interventional radiology technical assistance.   INDICATIONS:  This is a 65 year old gentleman with a large (>2cm) right  kidney stone.  The patient presented with right renal colic.  With extensive  counseling regarding his surgical options,  the patient elected for right  percutaneous nephrolithotomy.   DESCRIPTION OF PROCEDURE:  The patient was brought the morning of his  surgery to the interventional radiology suite where a right nephrostomy tube  drainage was inserted.  The patient was then brought to the operating room.  General anesthesia was induced.  A time-out was taken to properly identify  the patient and procedure going to be done.  Preoperative antibiotics were  given.  The patient was placed in the prone position.  His right flank and  right nephrostomy tube were prepped and draped in the normal sterile  fashion.  Under fluoroscopic guidance, a sensor wire was used and advanced  into the existing right angiographic catheter that was serving as a right  nephrostomy tube into the bladder.  The tube was then taken out leaving the  sensor wire in place.  A banana peel catheter was then inserted over the  sensor wire and advanced under fluoroscopic guidance.  The inner sheath of  catheter was then removed.  This allowed the insertion of  a second guidewire  into the bladder.  The sheath was then taken out leaving 2 wires in place.  The guidewire was then used as a safety wire throughout the case.  The  sensor wire was then used as the working wire.  The Nephromax balloon was  then inserted into the sensor wire and advanced under fluoroscopic guidance  into the right renal pelvis.  The balloon was then inflated to 24-French  diameter.  The access sheath was then advanced under fluoroscopic guidance  over the balloon.  The balloon was then deflated and taken out leaving the  access sheath in place.  The rigid nephroscope was then used to access the  kidney.  A large right renal pelvic stone was then noted.  The lithoclast  was then used and the stone was fragmented into 3 large pieces.  These  pieces were then removed using forceps.  The kidney collecting system was  then examined using endoscopy with no evidence of residual stone.  This was  confirmed by radiographical  absence of any residual stone.  Antegrade  pyelogram was then performed  with no evidence of filling defects suggesting  residual stones.  A 6 x 26 double-J stent was then inserted over the working  sensor wire under fluoroscopic guidance.  The wire was then taken out and  the double-J stent engaged in place with a good loop in the right renal  pelvis and a semicircular cord in the bladder.  The position of the double-J  stent coils was confirmed by fluoroscopy.  The proximal end of the double-J  stent was confirmed in the right renal pelvis by direct visualization using  the nephroscope.  The guidewire that was used as a safety wire was then  taken out.  The access sheath was then placed at the level of the right  kidney capsule as evidenced by the presence of perinephric fat at that  level.  A tissue sealant applicator was then placed at that level.  The  access sheath was then taken out leaving the applicator in place.  The  tissue sealant was then applied  to the kidney at that level and continued to  be applied as the applicator was drawn out of the body.  Adequate hemostasis  was seen at the level of the skin.  The skin was then closed using 4-0  Monocryl in a running subcuticular fashion.  The patient had a Foley  catheter that was inserted at the beginning of the case.  The patient was  woken up from anesthesia and extubated in the operating room wit no  complications.  He was taken in stable condition to PACU.  Please note that  Dr. Vernie Ammons was present and participated in the entire procedure as he was  the responsible surgeon.     ______________________________  Mathew Bryant, MD      Veverly Fells. Vernie Ammons, M.D.  Electronically Signed    SK/MEDQ  D:  08/14/2006  T:  08/15/2006  Job:  [id number]

## 2011-04-22 NOTE — Discharge Summary (Signed)
NAMELAYNE, DILAURO               ACCOUNT NO.:  [id number]   MEDICAL RECORD NO.:  [id number]          PATIENT TYPE:  INP   LOCATION:  5714                         FACILITY:  [facility]   PHYSICIAN:  Valetta Mole. Swords, M.D. [facility] OF BIRTH:  [birth year]   DATE OF ADMISSION:  04/28/2006  DATE OF DISCHARGE:  04/30/2006                                 DISCHARGE SUMMARY   DISCHARGE DIAGNOSES:  1.  Anterior mesenteric vein thrombosis.  2.  Partial superior mesenteric vein thrombosis.  3.  FUO likely secondary to above.  4.  Gastroesophageal reflux disease.  5.  Hypertension.  6.  Hypertestosteronemia.  7.  Attention deficit disorder.   DISCHARGE MEDICATIONS:  1.  Lovenox 5 mg subcu q.12 h until anticoagulation complete.  2.  Warfarin 7.5 mg p.o. daily or as directed.  3.  DynaCirc at his usual home dose.  4.  Atenolol at his usual home dose.  5.  Hydrochlorothiazide 25 mg p.o. daily.  6.  Nexium 40 mg p.o. daily.  7.  AndroGel at his usual home dose.  8.  Adderall at his usual home dose.  9.  Augmentin 375 mg p.o. b.i.d. x5 days.   PROCEDURES:  1.  MRI of the abdomen.  No evidence of pancreatic mass.  No global urea or      pancreatic ductal __________ superior mesenteric vein and pleural vein.  2.  CT of the abdomen was obtained at [facility], reviewed with      Dr. Reche Dixon, demonstrated an inferior mesenteric vein thrombosis.   LABORATORY DATA:  Pro time 1.0, Apr 30, 2006.  __________ revealed 11 on the  day of discharge.  Hemoglobin 13.4.  Blood cultures negative to date.  Hypercoagulable profile demonstrated 116 and 16.8.  Other laboratories are  pending.   FOLLOWUP:  Follow up with Dr. Cato Mulligan on Tuesday for pro time.   CONDITION ON DISCHARGE:  Improved.  No recurrent fever.   HOSPITAL COURSE:  Admitted to the Hospitalist Service on Apr 28, 2006.  See  my admission notes for details.  Patient with inferior mesenteric vein  thrombosis treated with IV heparin and  Warfarin.  The patient also had an  FUO and was started on broad-spectrum antibiotics intravenous (Zosyn).  The  patient's fever resolved on antibiotics and he was feeling well, tolerating  an appetite, ambulating in the hallways and eager to go home.  I think it is  safe to  convert the patient to subcutaneous Lovenox, continue Warfarin with complete  Augmentin at home and I will see him in the office for pro time on Tuesday  and the further follow up plans after that.  His other medical problems are  stable.  He understands to call for any recurrent fever.      Bruce Rexene Edison Swords, M.D. [facility]  Electronically Signed     BHS/MEDQ  D:  04/30/2006  T:  04/30/2006  Job:  [id number]

## 2011-04-25 ENCOUNTER — Other Ambulatory Visit (INDEPENDENT_AMBULATORY_CARE_PROVIDER_SITE_OTHER): Payer: Medicare Other | Admitting: Internal Medicine

## 2011-04-25 DIAGNOSIS — I4891 Unspecified atrial fibrillation: Secondary | ICD-10-CM

## 2011-04-25 DIAGNOSIS — D649 Anemia, unspecified: Secondary | ICD-10-CM

## 2011-04-25 DIAGNOSIS — T887XXA Unspecified adverse effect of drug or medicament, initial encounter: Secondary | ICD-10-CM

## 2011-04-25 DIAGNOSIS — N419 Inflammatory disease of prostate, unspecified: Secondary | ICD-10-CM

## 2011-04-25 DIAGNOSIS — E785 Hyperlipidemia, unspecified: Secondary | ICD-10-CM

## 2011-04-25 LAB — CBC WITH DIFFERENTIAL/PLATELET
Basophils Relative: 0.8 % (ref 0.0–3.0)
Eosinophils Absolute: 0.3 10*3/uL (ref 0.0–0.7)
Eosinophils Relative: 4.3 % (ref 0.0–5.0)
HCT: 44.4 % (ref 39.0–52.0)
Lymphs Abs: 1.4 10*3/uL (ref 0.7–4.0)
MCHC: 34 g/dL (ref 30.0–36.0)
MCV: 92.6 fl (ref 78.0–100.0)
Monocytes Absolute: 0.5 10*3/uL (ref 0.1–1.0)
Neutrophils Relative %: 63.7 % (ref 43.0–77.0)
Platelets: 190 10*3/uL (ref 150.0–400.0)
RBC: 4.8 Mil/uL (ref 4.22–5.81)

## 2011-04-25 LAB — HEPATIC FUNCTION PANEL
ALT: 34 U/L (ref 0–53)
Albumin: 3.7 g/dL (ref 3.5–5.2)
Bilirubin, Direct: 0.1 mg/dL (ref 0.0–0.3)
Total Protein: 6.3 g/dL (ref 6.0–8.3)

## 2011-04-25 LAB — LIPID PANEL
Cholesterol: 251 mg/dL — ABNORMAL HIGH (ref 0–200)
HDL: 46 mg/dL (ref >39.00)
Triglycerides: 291 mg/dL — ABNORMAL HIGH (ref 0.0–149.0)

## 2011-04-25 LAB — PSA: PSA: 1.86 ng/mL (ref 0.10–4.00)

## 2011-04-27 ENCOUNTER — Encounter: Payer: Self-pay | Admitting: Internal Medicine

## 2011-04-28 ENCOUNTER — Ambulatory Visit (INDEPENDENT_AMBULATORY_CARE_PROVIDER_SITE_OTHER): Payer: Medicare Other | Admitting: Internal Medicine

## 2011-04-28 ENCOUNTER — Encounter: Payer: Self-pay | Admitting: Internal Medicine

## 2011-04-28 VITALS — BP 110/74 | HR 60 | Temp 98.6°F | Ht 71.0 in | Wt 227.0 lb

## 2011-04-28 DIAGNOSIS — I4891 Unspecified atrial fibrillation: Secondary | ICD-10-CM

## 2011-04-28 DIAGNOSIS — C61 Malignant neoplasm of prostate: Secondary | ICD-10-CM

## 2011-04-28 DIAGNOSIS — F909 Attention-deficit hyperactivity disorder, unspecified type: Secondary | ICD-10-CM

## 2011-04-28 DIAGNOSIS — E785 Hyperlipidemia, unspecified: Secondary | ICD-10-CM

## 2011-04-28 MED ORDER — AMPHETAMINE-DEXTROAMPHET ER 15 MG PO CP24: 15.0000 mg | ORAL_CAPSULE | Freq: Every day | ORAL | Status: DC

## 2011-04-28 MED ORDER — ATORVASTATIN CALCIUM 10 MG PO TABS: 10.0000 mg | ORAL_TABLET | Freq: Every day | ORAL | Status: DC

## 2011-04-28 NOTE — Assessment & Plan Note (Signed)
Chronic anticoagulation---needs lifelong (hypercoagulable state)

## 2011-04-28 NOTE — Progress Notes (Signed)
  Subjective:    Patient ID: Carollee Sires, male    DOB: 04/15/1946, 65 y.o.   MRN: [id number]  HPI  F/u htn---tolerating meds Prostate CA---seed implants---ottelin AFIB---no bleeding on warfarin---he understands need for monthly protime (known hypercoagulable state)  Past Medical History  Diagnosis Date  . A-fib   . Hypertension   . Hyperlipidemia   . GERD (gastroesophageal reflux disease)   . Unspecified disease of pericardium   . Hyperpotassemia   . Acute kidney failure, unspecified   . Systemic inflammatory response syndrome, unspecified   . Acute vascular insufficiency of intestine   . Elevated prostate specific antigen (PSA)   . Personal history of urinary calculi   . Primary hypercoagulable state   . [facility] spotted fever   . Prostate cancer   . Diverticulosis    Past Surgical History  Procedure Date  . Knee arthroscopy   . Appendectomy   . Tonsillectomy     reports that he quit smoking about 20 years ago. He does not have any smokeless tobacco history on file. He reports that he drinks alcohol. He reports that he does not use illicit drugs. family history is not on file. Allergies  Allergen Reactions  . Sulfamethoxazole W/Trimethoprim     REACTION: rash     Review of Systems  patient denies chest pain, shortness of breath, orthopnea. Denies lower extremity edema, abdominal pain, change in appetite, change in bowel movements. Patient denies rashes, musculoskeletal complaints. No other specific complaints in a complete review of systems.      Objective:   Physical Exam  Well-developed well-nourished male in no acute distress. HEENT exam atraumatic, normocephalic, extraocular muscles are intact. Neck is supple. No jugular venous distention no thyromegaly. Chest clear to auscultation without increased work of breathing. Cardiac exam S1 and S2 are regular. Abdominal exam active bowel sounds, soft, nontender. Extremities no edema. Neurologic exam she is  alert without any motor sensory deficits. Gait is normal.        Assessment & Plan:

## 2011-04-28 NOTE — Assessment & Plan Note (Signed)
Followed by Vernie Ammons

## 2011-04-28 NOTE — Assessment & Plan Note (Signed)
Needs treatment

## 2011-05-27 ENCOUNTER — Telehealth: Payer: Self-pay | Admitting: *Deleted

## 2011-05-27 MED ORDER — ROSUVASTATIN CALCIUM 10 MG PO TABS: 10.0000 mg | ORAL_TABLET | Freq: Every day | ORAL | Status: DC

## 2011-05-27 NOTE — Telephone Encounter (Signed)
Discontinue lipitor Try crestor 10 mg . 1/2 po every other day

## 2011-05-27 NOTE — Telephone Encounter (Signed)
Notified pt. 

## 2011-05-27 NOTE — Telephone Encounter (Signed)
Pt cannot take Lipitor.....makes his body hurt and completely depletes his energy.  Sleeps all the time.

## 2011-06-19 ENCOUNTER — Other Ambulatory Visit: Payer: Self-pay | Admitting: Internal Medicine

## 2011-06-20 ENCOUNTER — Other Ambulatory Visit: Payer: BC Managed Care – PPO

## 2011-06-24 ENCOUNTER — Ambulatory Visit (INDEPENDENT_AMBULATORY_CARE_PROVIDER_SITE_OTHER): Payer: Medicare Other | Admitting: Internal Medicine

## 2011-06-24 ENCOUNTER — Encounter: Payer: Self-pay | Admitting: Internal Medicine

## 2011-06-24 DIAGNOSIS — J069 Acute upper respiratory infection, unspecified: Secondary | ICD-10-CM

## 2011-06-24 DIAGNOSIS — I10 Essential (primary) hypertension: Secondary | ICD-10-CM

## 2011-06-24 DIAGNOSIS — E785 Hyperlipidemia, unspecified: Secondary | ICD-10-CM

## 2011-06-24 MED ORDER — DOXYCYCLINE HYCLATE 100 MG PO CAPS: 100.0000 mg | ORAL_CAPSULE | Freq: Two times a day (BID) | ORAL | Status: AC

## 2011-06-24 MED ORDER — HYDROCODONE-HOMATROPINE 5-1.5 MG/5ML PO SYRP: 5.0000 mL | ORAL_SOLUTION | Freq: Four times a day (QID) | ORAL | Status: AC | PRN

## 2011-06-24 NOTE — Progress Notes (Signed)
  Subjective:    Patient ID: Mathew Randolph, male    DOB: Jun 19, 1946, 65 y.o.   MRN: [id number]  HPI  65 year old patient who is seen today with a one-week history of head and chest congestion. He has been using over-the-counter medications including decongestants and expectorants and has worsened. He has rhinorrhea but now has developed increasing chest congestion and productive cough often discolored. There has been some fever myalgias and a general sense of wellness;  he has developed worsening chest congestion and refractory cough.  He has a history of dyslipidemia and Lipitor has been discontinued due to myalgias and presently is on 5 mg of Crestor daily. Myalgias have resumed  He has treated hypertension and a history of paroxysmal atrial fibrillation.  He is on chronic Coumadin anticoagulation due to lupus anticoagulant and hypercoagulable state complicated by mesenteric thrombosis    Review of Systems  Constitutional: Positive for fever and fatigue. Negative for chills and appetite change.  HENT: Positive for congestion and rhinorrhea. Negative for hearing loss, ear pain, sore throat, trouble swallowing, neck stiffness, dental problem, voice change and tinnitus.   Eyes: Negative for pain, discharge and visual disturbance.  Respiratory: Positive for cough, chest tightness and shortness of breath. Negative for wheezing and stridor.   Cardiovascular: Negative for chest pain, palpitations and leg swelling.  Gastrointestinal: Negative for nausea, vomiting, abdominal pain, diarrhea, constipation, blood in stool and abdominal distention.  Genitourinary: Negative for urgency, hematuria, flank pain, discharge, difficulty urinating and genital sores.  Musculoskeletal: Negative for myalgias, back pain, joint swelling, arthralgias and gait problem.  Skin: Negative for rash.  Neurological: Negative for dizziness, syncope, speech difficulty, weakness, numbness and headaches.  Hematological:  Negative for adenopathy. Does not bruise/bleed easily.  Psychiatric/Behavioral: Negative for behavioral problems and dysphoric mood. The patient is not nervous/anxious.        Objective:   Physical Exam  Constitutional: He is oriented to person, place, and time. He appears well-developed.  HENT:  Head: Normocephalic.  Right Ear: External ear normal.  Left Ear: External ear normal.  Eyes: Conjunctivae and EOM are normal.  Neck: Normal range of motion.  Cardiovascular: Normal rate and normal heart sounds.   Pulmonary/Chest: Effort normal. No respiratory distress.       Scattered rhonchi O2 saturation 97  Pulse rate 1:15  Abdominal: Bowel sounds are normal.  Musculoskeletal: Normal range of motion. He exhibits no edema and no tenderness.  Neurological: He is alert and oriented to person, place, and time.  Psychiatric: He has a normal mood and affect. His behavior is normal.          Assessment & Plan:   Acute  bronchitis. We'll continue expectorants and treat symptomatically with antitussives. Will treat with doxycycline for 7 days Dyslipidemia. After the patient has clinically improved we'll try Crestor on a weekly regimen

## 2011-06-24 NOTE — Patient Instructions (Signed)
Get plenty of rest, Drink lots of  clear liquids, and use Tylenol  for fever and discomfort.    Take your antibiotic as prescribed until ALL of it is gone, but stop if you develop a rash, swelling, or any side effects of the medication.  Contact our office as soon as possible if  there are side effects of the medication.  After your acute illness has resolved, try Crestor once weekly

## 2011-07-06 ENCOUNTER — Other Ambulatory Visit: Payer: Self-pay | Admitting: *Deleted

## 2011-07-06 MED ORDER — LISINOPRIL 40 MG PO TABS: 40.0000 mg | ORAL_TABLET | Freq: Every day | ORAL | Status: DC

## 2011-08-02 ENCOUNTER — Other Ambulatory Visit: Payer: Self-pay | Admitting: Internal Medicine

## 2011-08-22 ENCOUNTER — Ambulatory Visit: Payer: BC Managed Care – PPO

## 2011-08-22 DIAGNOSIS — I4891 Unspecified atrial fibrillation: Secondary | ICD-10-CM

## 2011-08-22 NOTE — Patient Instructions (Signed)
Same dose check in 4 weeks

## 2011-08-25 ENCOUNTER — Telehealth: Payer: Self-pay | Admitting: Internal Medicine

## 2011-08-25 ENCOUNTER — Other Ambulatory Visit: Payer: Self-pay | Admitting: *Deleted

## 2011-08-25 DIAGNOSIS — I10 Essential (primary) hypertension: Secondary | ICD-10-CM

## 2011-08-25 DIAGNOSIS — F909 Attention-deficit hyperactivity disorder, unspecified type: Secondary | ICD-10-CM

## 2011-08-25 DIAGNOSIS — I4891 Unspecified atrial fibrillation: Secondary | ICD-10-CM

## 2011-08-25 DIAGNOSIS — K219 Gastro-esophageal reflux disease without esophagitis: Secondary | ICD-10-CM

## 2011-08-25 MED ORDER — WARFARIN SODIUM 5 MG PO TABS: 5.0000 mg | ORAL_TABLET | Freq: Every day | ORAL | Status: DC

## 2011-08-25 MED ORDER — SILDENAFIL CITRATE 50 MG PO TABS: 50.0000 mg | ORAL_TABLET | Freq: Every day | ORAL | Status: DC | PRN

## 2011-08-25 MED ORDER — AMPHETAMINE-DEXTROAMPHET ER 15 MG PO CP24: 15.0000 mg | ORAL_CAPSULE | Freq: Every day | ORAL | Status: DC

## 2011-08-25 MED ORDER — LISINOPRIL 40 MG PO TABS: 40.0000 mg | ORAL_TABLET | Freq: Every day | ORAL | Status: DC

## 2011-08-25 MED ORDER — ROSUVASTATIN CALCIUM 10 MG PO TABS: 10.0000 mg | ORAL_TABLET | Freq: Every day | ORAL | Status: DC

## 2011-08-25 MED ORDER — ESOMEPRAZOLE MAGNESIUM 40 MG PO CPDR: 40.0000 mg | DELAYED_RELEASE_CAPSULE | Freq: Every day | ORAL | Status: DC

## 2011-08-25 MED ORDER — CELECOXIB 200 MG PO CAPS: 200.0000 mg | ORAL_CAPSULE | Freq: Two times a day (BID) | ORAL | Status: DC

## 2011-08-25 MED ORDER — METOPROLOL SUCCINATE ER 50 MG PO TB24: 75.0000 mg | ORAL_TABLET | Freq: Every day | ORAL | Status: DC

## 2011-08-25 NOTE — Telephone Encounter (Signed)
rx sent in electronically 

## 2011-08-25 NOTE — Telephone Encounter (Signed)
Pt requesting a 90 day supply of Sildenafil 50mg  and Rosuvastatin 10 mg to [facility]

## 2011-08-31 ENCOUNTER — Other Ambulatory Visit: Payer: Self-pay | Admitting: *Deleted

## 2011-09-01 ENCOUNTER — Other Ambulatory Visit: Payer: Self-pay | Admitting: Internal Medicine

## 2011-09-07 LAB — DIFFERENTIAL
Basophils Relative: 1
Lymphs Abs: 1.3
Monocytes Absolute: 0.7
Monocytes Relative: 9
Neutro Abs: 6

## 2011-09-07 LAB — CBC
HCT: 28.9 — ABNORMAL LOW
Hemoglobin: 9.2 — ABNORMAL LOW
Hemoglobin: 9.2 — ABNORMAL LOW
Hemoglobin: 9.4 — ABNORMAL LOW
Hemoglobin: 9.6 — ABNORMAL LOW
MCHC: 33
MCHC: 33.5
MCV: 88.6
MCV: 88.6
Platelets: 440 — ABNORMAL HIGH
Platelets: 483 — ABNORMAL HIGH
RBC: 3.11 — ABNORMAL LOW
RBC: 3.13 — ABNORMAL LOW
RBC: 3.23 — ABNORMAL LOW
RDW: 14.7
RDW: 14.8
WBC: 8.3

## 2011-09-07 LAB — URINE CULTURE: Colony Count: NO GROWTH

## 2011-09-07 LAB — BASIC METABOLIC PANEL
BUN: 4 — ABNORMAL LOW
CO2: 24
CO2: 30
Calcium: 8.5
Calcium: 8.7
Calcium: 9
Chloride: 100
Chloride: 105
Creatinine, Ser: 0.97
GFR calc Af Amer: >60
GFR calc Af Amer: >60
GFR calc non Af Amer: >60
Sodium: 132 — ABNORMAL LOW
Sodium: 139

## 2011-09-07 LAB — PROTIME-INR
INR: 1.5
INR: 1.5
INR: 1.6 — ABNORMAL HIGH
Prothrombin Time: 19 — ABNORMAL HIGH
Prothrombin Time: 22.6 — ABNORMAL HIGH

## 2011-09-07 LAB — URINALYSIS, ROUTINE W REFLEX MICROSCOPIC
Hgb urine dipstick: NEGATIVE
Nitrite: NEGATIVE
Specific Gravity, Urine: 1.008
Urobilinogen, UA: 0.2
pH: 7

## 2011-09-07 LAB — HEPARIN LEVEL (UNFRACTIONATED)
Heparin Unfractionated: <0.1 — ABNORMAL LOW
Heparin Unfractionated: <0.1 — ABNORMAL LOW

## 2011-09-07 LAB — VANCOMYCIN, TROUGH: Vancomycin Tr: 26.2

## 2011-09-19 ENCOUNTER — Other Ambulatory Visit: Payer: Self-pay | Admitting: Urology

## 2011-09-19 ENCOUNTER — Ambulatory Visit (HOSPITAL_COMMUNITY): Admission: RE | Admit: 2011-09-19 | Payer: Medicare Other | Source: Ambulatory Visit

## 2011-09-19 ENCOUNTER — Ambulatory Visit (HOSPITAL_BASED_OUTPATIENT_CLINIC_OR_DEPARTMENT_OTHER)
Admission: RE | Admit: 2011-09-19 | Discharge: 2011-09-19 | Disposition: A | Discharge status: Home / Self Care | Payer: BC Managed Care – PPO | Source: Ambulatory Visit | Attending: Urology | Admitting: Urology

## 2011-09-19 ENCOUNTER — Other Ambulatory Visit: Payer: Self-pay | Admitting: Internal Medicine

## 2011-09-19 DIAGNOSIS — I4891 Unspecified atrial fibrillation: Secondary | ICD-10-CM | POA: Insufficient documentation

## 2011-09-19 DIAGNOSIS — Z79899 Other long term (current) drug therapy: Secondary | ICD-10-CM | POA: Insufficient documentation

## 2011-09-19 DIAGNOSIS — I10 Essential (primary) hypertension: Secondary | ICD-10-CM | POA: Insufficient documentation

## 2011-09-19 DIAGNOSIS — C671 Malignant neoplasm of dome of bladder: Secondary | ICD-10-CM | POA: Insufficient documentation

## 2011-09-19 HISTORY — PX: TRANSURETHRAL RESECTION OF BLADDER TUMOR: SHX2575

## 2011-09-19 LAB — BASIC METABOLIC PANEL
BUN: 16 mg/dL (ref 6–23)
CO2: 27 mEq/L (ref 19–32)
Calcium: 9.7 mg/dL (ref 8.4–10.5)
Chloride: 104 mEq/L (ref 96–112)
Creatinine, Ser: 1.06 mg/dL (ref 0.50–1.35)
GFR calc Af Amer: 83 mL/min — ABNORMAL LOW (ref >90)
GFR calc non Af Amer: 72 mL/min — ABNORMAL LOW (ref >90)
Glucose, Bld: 105 mg/dL — ABNORMAL HIGH (ref 70–99)
Potassium: 4.4 mEq/L (ref 3.5–5.1)
Sodium: 138 mEq/L (ref 135–145)

## 2011-09-19 LAB — PROTIME-INR
INR: 0.98 (ref 0.00–1.49)
Prothrombin Time: 13.2 seconds (ref 11.6–15.2)

## 2011-09-19 LAB — HEMOGLOBIN: Hemoglobin: 13.5 g/dL (ref 13.0–17.0)

## 2011-09-21 NOTE — Op Note (Addendum)
NAMEMAJOR, SANTERRE NO.:  [id number]  MEDICAL RECORD NO.:  [id number]  LOCATION:  XRAY                         FACILITY:  [facility]  PHYSICIAN:  Daschel Roughton C. Vernie Ammons, M.D.  DATE OF BIRTH:  01-17-1946  DATE OF PROCEDURE:  09/19/2011 DATE OF DISCHARGE:                              OPERATIVE REPORT   PREOPERATIVE DIAGNOSIS:  Bladder tumor.  POSTOPERATIVE DIAGNOSIS:  Bladder tumor.  PROCEDURES: 1. Transurethral resection of bladder tumors (3 cm). 2. Installation of intravesical chemotherapy (mitomycin-C).  SURGEON:  Horald Birky C. Vernie Ammons, M.D.  ANESTHESIA:  General.  SPECIMENS: 1. Bladder tumor. 2. Resection of bladder tumor base. Both to Pathology.  DRAINS:  18-French Foley catheter.  BLOOD LOSS:  Minimal.  COMPLICATIONS:  None.  INDICATIONS:  The patient is a 65 year old male who has had previous prostate cancer, treated with radiation.  He had an episode of gross hematuria and upper tract studies revealed no abnormality; however, cystoscopy revealed a bladder tumor located just to the right of the midline at the dome of the bladder.  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  I discussed transurethral resection with the patient and gone over the procedure, its risks and complications, the likelihood of success and the alternatives.  We also discussed intravesical mitomycin therapy to reduce his risk of recurrence.  He understands and has elected to proceed.  DESCRIPTION OF OPERATION:  After informed consent, the patient was brought to the major OR, placed on the table, administered general LMA anesthesia, and then moved to the dorsal lithotomy position.  His genitalia was sterilely prepped and draped.  An official time-out was then performed.  His urethral meatus was sounded with Sissy Hoff sounds to 30-French, then the 28-French resectoscope sheath with Timberlake obturator was introduced into the bladder and the obturator  was removed.  The 12- degree lens with resectoscope element was inserted and I then withdrew the scope back to the prostatic urethra, which revealed no lesions. There was bilobar hypertrophy and some slight elongation of the prostatic urethra.  On re-entering the bladder, I noted the ureteral orifices were of normal configuration and position.  On the posterior right wall of the bladder, near the floor, was a wide-mouth diverticulum.  I passed the scope into the diverticulum and noted no lesions.  The bladder was then fully and systematically inspected and the single bladder tumor located on the dome just to the right of midline was identified, but no other tumors or worrisome lesions were noted within the bladder.  I initially began resecting the bladder tumor, but the patient was breathing deeply and my concern was that of perforation due to the location of the tumor at the dome and therefore the patient was intubated and paralyzed.  This allowed much more controlled resection of his tumor.  I was able to fully resect the tumor without difficulty or perforation.  The tumor was removed from the bladder using the Microvasive evacuator and then I resected into the wall of the bladder at the base of the tumor to obtain muscle for pathologic evaluation as well and this was sent separately.  There was no perforation noted and the bladder was  fully inspected and no bladder tumor fragments were present in the bladder.  I therefore drained the bladder and removed the resectoscope.  An 18-French Foley catheter was then inserted in the bladder and after being sure the bladder was completely empty, I instilled 40 mg of mitomycin-C and 40 mL of normal saline and plugged the catheter, which will remain indwelling while the mitomycin-C is present in the bladder. Then between 30 minutes and 1 hour, the bladder will be drained and the catheter will be removed.  The patient tolerated the procedure  well, there were no intraoperative complications.  He will be given a prescription for Pyridium 200 mg #36 and Vicodin HP #24 with written instructions and follow up in my office in 1 week to go over the pathology results.     Jacora Hopkins C. Vernie Ammons, M.D.     MCO/MEDQ  D:  09/19/2011  T:  09/19/2011  Job:  [id number]  Electronically Signed by Ihor Gully M.D. on 09/21/2011 07:19:05 AM

## 2011-09-24 ENCOUNTER — Other Ambulatory Visit: Payer: Self-pay | Admitting: Internal Medicine

## 2011-10-10 ENCOUNTER — Ambulatory Visit (INDEPENDENT_AMBULATORY_CARE_PROVIDER_SITE_OTHER): Payer: Medicare Other | Admitting: Family Medicine

## 2011-10-10 ENCOUNTER — Encounter: Payer: Self-pay | Admitting: Family Medicine

## 2011-10-10 VITALS — BP 110/70 | Temp 99.2°F | Wt 231.0 lb

## 2011-10-10 DIAGNOSIS — F909 Attention-deficit hyperactivity disorder, unspecified type: Secondary | ICD-10-CM

## 2011-10-10 DIAGNOSIS — Z23 Encounter for immunization: Secondary | ICD-10-CM

## 2011-10-10 DIAGNOSIS — I4891 Unspecified atrial fibrillation: Secondary | ICD-10-CM

## 2011-10-10 DIAGNOSIS — J329 Chronic sinusitis, unspecified: Secondary | ICD-10-CM

## 2011-10-10 LAB — POCT INR: INR: 1.7

## 2011-10-10 MED ORDER — AMPHETAMINE-DEXTROAMPHET ER 15 MG PO CP24: 15.0000 mg | ORAL_CAPSULE | Freq: Every day | ORAL | Status: DC

## 2011-10-10 MED ORDER — TETANUS-DIPHTH-ACELL PERTUSSIS 5-2.5-18.5 LF-MCG/0.5 IM SUSP: 0.5000 mL | Freq: Once | INTRAMUSCULAR | Status: DC

## 2011-10-10 MED ORDER — AMOXICILLIN-POT CLAVULANATE 875-125 MG PO TABS: 1.0000 | ORAL_TABLET | Freq: Two times a day (BID) | ORAL | Status: AC

## 2011-10-10 NOTE — Patient Instructions (Signed)
  Latest dosing instructions   Total Sun Mon Tue Wed Thu Fri Sat   50 7.5 mg 7.5 mg 7.5 mg 7.5 mg 7.5 mg 5 mg 7.5 mg    (5 mg1.5) (5 mg1.5) (5 mg1.5) (5 mg1.5) (5 mg1.5) (5 mg1) (5 mg1.5)        

## 2011-10-10 NOTE — Progress Notes (Signed)
  Subjective:    Patient ID: Mathew Randolph, male    DOB: Dec 09, 1945, 65 y.o.   MRN: [id number]  HPI  Acute visit. Patient presents with some progressive nasal congestion, facial pain and productive cough for the past week. Started after getting up some leaves. He had some intermittent mild headache. Question low-grade fever off and on. He has history of bladder cancer and remote history of prostate cancer. Reported allergy to sulfa.  No relief with OTC meds.  Patient also requesting flu vaccine and tetanus booster. New grandchild and needs Tdap.  Patient also requesting refills of Adderall for ADD.   No hx of CAD.  Denies side effects of medication.   Review of Systems  Constitutional: Positive for fever and fatigue. Negative for chills.  HENT: Positive for sinus pressure. Negative for ear pain.   Respiratory: Positive for cough. Negative for shortness of breath and wheezing.   Cardiovascular: Negative for chest pain.  Neurological: Negative for weakness and headaches.  Psychiatric/Behavioral: Negative for decreased concentration.       Objective:   Physical Exam  Constitutional: He appears well-developed and well-nourished.  HENT:  Right Ear: External ear normal.  Left Ear: External ear normal.  Mouth/Throat: Oropharynx is clear and moist.  Neck: Neck supple.  Cardiovascular: Normal rate and regular rhythm.   Pulmonary/Chest: Effort normal and breath sounds normal. No respiratory distress. He has no wheezes. He has no rales.  Lymphadenopathy:    He has no cervical adenopathy.          Assessment & Plan:  Acute sinusitis. Augmentin 875 mg twice daily for 10 days. Patient takes Coumadin for atrial fibrillation. Check INR. Cautioned about possible over anticoagulation with antibiotics  ADD.  Refilled Adderall for 3 months with further refills per primary.

## 2011-10-14 ENCOUNTER — Ambulatory Visit (INDEPENDENT_AMBULATORY_CARE_PROVIDER_SITE_OTHER)
Admission: RE | Admit: 2011-10-14 | Discharge: 2011-10-14 | Disposition: A | Discharge status: Home / Self Care | Payer: BC Managed Care – PPO | Source: Ambulatory Visit | Attending: Internal Medicine | Admitting: Internal Medicine

## 2011-10-14 ENCOUNTER — Encounter: Payer: Self-pay | Admitting: Internal Medicine

## 2011-10-14 ENCOUNTER — Ambulatory Visit (INDEPENDENT_AMBULATORY_CARE_PROVIDER_SITE_OTHER): Payer: Medicare Other | Admitting: Internal Medicine

## 2011-10-14 VITALS — BP 120/64 | HR 57 | Temp 98.2°F

## 2011-10-14 DIAGNOSIS — R05 Cough: Secondary | ICD-10-CM | POA: Insufficient documentation

## 2011-10-14 DIAGNOSIS — I4891 Unspecified atrial fibrillation: Secondary | ICD-10-CM

## 2011-10-14 DIAGNOSIS — R059 Cough, unspecified: Secondary | ICD-10-CM

## 2011-10-14 MED ORDER — HYDROCODONE-HOMATROPINE 5-1.5 MG/5ML PO SYRP: 5.0000 mL | ORAL_SOLUTION | Freq: Three times a day (TID) | ORAL | Status: AC | PRN

## 2011-10-14 NOTE — Assessment & Plan Note (Addendum)
65 year old white male with persistent cough despite treatment for possible sinusitis with Augmentin. He has mildly coarse breath sounds on exam. Rule out atypical pneumonia. Obtain chest x-ray. If chest x-ray is normal patient may have unresolved viral URI. I suggested use of neil med sinus rinse and Mucinex. Patient advised to call office if symptoms persist or worsen. Patient is not currently using Vicodin or tramadol.  Prescription for Hycodan for cough provided.

## 2011-10-14 NOTE — Progress Notes (Signed)
Subjective:    Patient ID: Mathew Randolph, male    DOB: 03-24-1946, 65 y.o.   MRN: [id number]  HPI  65 y/o white male complains of persistent cough. He was seen one week ago by Dr. Caryl Never for possible sinusitis. He was treated with Augmentin twice daily. Despite antibiotics he feels like his cough is getting worse and settling into his chest. Patient has not been able to rest over the last week. he recently returned from a business trip to [place]. Cough was initially productive of yellowish sputum, now sputum is brown in color. He denies fever but has experienced slight chills. He has mild shortness of breath.  Patient has A. fib and is anticoagulated.  INR has not been checked since starting antibiotics.   Review of Systems Negative for fever,  Mild shortness of breath    Past Medical History  Diagnosis Date  . A-fib   . Hypertension   . Hyperlipidemia   . GERD (gastroesophageal reflux disease)   . Unspecified disease of pericardium   . Hyperpotassemia   . Acute kidney failure, unspecified   . Systemic inflammatory response syndrome, unspecified   . Acute vascular insufficiency of intestine   . Elevated prostate specific antigen (PSA)   . Personal history of urinary calculi   . Primary hypercoagulable state   . [facility] spotted fever   . Prostate cancer   . Diverticulosis     History   Social History  . Marital Status: Married    Spouse Name: N/A    Number of Children: N/A  . Years of Education: N/A   Occupational History  . Not on file.   Social History Main Topics  . Smoking status: Former Smoker    Quit date: 04/28/1991  . Smokeless tobacco: Not on file  . Alcohol Use: Yes  . Drug Use: No  . Sexually Active: Not on file   Other Topics Concern  . Not on file   Social History Narrative  . No narrative on file    Past Surgical History  Procedure Date  . Knee arthroscopy   . Appendectomy   . Tonsillectomy     No family  history on file.  Allergies  Allergen Reactions  . Sulfamethoxazole W/Trimethoprim     REACTION: rash    Current Outpatient Prescriptions on File Prior to Visit  Medication Sig Dispense Refill  . albuterol (PROAIR HFA) 108 (90 BASE) MCG/ACT inhaler Inhale 2 puffs into the lungs every 6 (six) hours as needed.        Marland Kitchen amoxicillin-clavulanate (AUGMENTIN) 875-125 MG per tablet Take 1 tablet by mouth 2 (two) times daily.  20 tablet  0  . amphetamine-dextroamphetamine (ADDERALL XR, 15MG ,) 15 MG 24 hr capsule Take 1 capsule (15 mg total) by mouth daily.  30 capsule  0  . amphetamine-dextroamphetamine (ADDERALL XR, 15MG ,) 15 MG 24 hr capsule Take 1 capsule (15 mg total) by mouth daily.  30 capsule  0  . amphetamine-dextroamphetamine (ADDERALL XR, 15MG ,) 15 MG 24 hr capsule Take 1 capsule (15 mg total) by mouth daily.  30 capsule  0  . celecoxib (CELEBREX) 200 MG capsule Take 1 capsule (200 mg total) by mouth 2 (two) times daily.  90 capsule  3  . esomeprazole (NEXIUM) 40 MG capsule Take 1 capsule (40 mg total) by mouth daily before breakfast.  90 capsule  3  . glucosamine-chondroitin 500-400 MG tablet Take 1 tablet by mouth 3 (three) times daily.        Marland Kitchen  HYDROcodone-acetaminophen (NORCO) 10-325 MG per tablet Take 1 tablet by mouth every 6 (six) hours as needed.        Marland Kitchen lisinopril (PRINIVIL,ZESTRIL) 40 MG tablet Take 1 tablet (40 mg total) by mouth daily.  90 tablet  3  . metoprolol (TOPROL-XL) 50 MG 24 hr tablet TAKE ONE AND ONE-HALF TABLET BY MOUTH ONCE DAILY  45 tablet  0  . rosuvastatin (CRESTOR) 10 MG tablet Take 1 tablet (10 mg total) by mouth at bedtime. Take 1/2 daily   90 tablet  3  . sertraline (ZOLOFT) 50 MG tablet TAKE 1 TABLET EVERY DAY  30 tablet  5  . sildenafil (VIAGRA) 50 MG tablet Take 1 tablet (50 mg total) by mouth daily as needed.  30 tablet  3  . Tamsulosin HCl (FLOMAX) 0.4 MG CAPS Take 0.4 mg by mouth daily.        . traMADol (ULTRAM) 50 MG tablet Take 50 mg by mouth every  6 (six) hours as needed.        . warfarin (COUMADIN) 5 MG tablet Take 1 tablet (5 mg total) by mouth daily.  90 tablet  3   Current Facility-Administered Medications on File Prior to Visit  Medication Dose Route Frequency Provider Last Rate Last Dose  . TDaP (BOOSTRIX) injection 0.5 mL  0.5 mL Intramuscular Once Bruce W Burchette        BP 120/64  Pulse 57  Temp(Src) 98.2 F (36.8 C) (Oral)  SpO2 96%    Objective:   Physical Exam   Constitutional: Appears well-developed and well-nourished. No distress.  Head: Normocephalic and atraumatic.  Ear:  Right and left ear normal.  TMs clear.  Hearing is grossly normal Mouth/Throat: Oropharynx is clear and moist.  Eyes: Conjunctivae are normal. Pupils are equal, round, and reactive to light.  Neck: Normal range of motion. Neck supple. No thyromegaly present. No neck tenderness, adenopathy Cardiovascular: Normal rate, regular rhythm and normal heart sounds.  Exam reveals no gallop and no friction rub.  No murmur heard. Pulmonary/Chest: Effort normal . Slightly coarse breath sounds at bases, no wheezing or rhonchi  Neurological: Alert. No cranial nerve deficit.  Skin: Skin is warm and dry.  Psychiatric: Normal mood and affect. Behavior is normal.         Assessment & Plan:

## 2011-10-14 NOTE — Patient Instructions (Addendum)
Use neil med sinus rinse over the counter. Use mucinex 2 x daily over the counter Our office will contact you re:   Chest x ray results Please call our office if your symptoms do not improve or gets worse.

## 2011-10-14 NOTE — Assessment & Plan Note (Signed)
Point-of-care INR was performed today which was 1.8. This was done considering use of broad-spectrum antibiotic while patient on Coumadin. Patient was advised to continue same dose for now.

## 2011-10-17 ENCOUNTER — Ambulatory Visit: Payer: BC Managed Care – PPO | Admitting: Internal Medicine

## 2011-10-19 ENCOUNTER — Ambulatory Visit: Payer: BC Managed Care – PPO | Admitting: Internal Medicine

## 2011-10-31 ENCOUNTER — Other Ambulatory Visit: Payer: Self-pay | Admitting: *Deleted

## 2011-10-31 MED ORDER — SERTRALINE HCL 50 MG PO TABS: 50.0000 mg | ORAL_TABLET | Freq: Every day | ORAL | Status: DC

## 2011-12-16 DIAGNOSIS — C671 Malignant neoplasm of dome of bladder: Secondary | ICD-10-CM | POA: Diagnosis not present

## 2011-12-20 ENCOUNTER — Other Ambulatory Visit: Payer: Self-pay | Admitting: Urology

## 2012-01-09 ENCOUNTER — Ambulatory Visit (INDEPENDENT_AMBULATORY_CARE_PROVIDER_SITE_OTHER)
Admission: RE | Admit: 2012-01-09 | Discharge: 2012-01-09 | Disposition: A | Discharge status: Home / Self Care | Payer: BC Managed Care – PPO | Source: Ambulatory Visit | Attending: Family | Admitting: Family

## 2012-01-09 ENCOUNTER — Telehealth: Payer: Self-pay | Admitting: *Deleted

## 2012-01-09 ENCOUNTER — Encounter: Payer: Self-pay | Admitting: Family

## 2012-01-09 ENCOUNTER — Ambulatory Visit (INDEPENDENT_AMBULATORY_CARE_PROVIDER_SITE_OTHER): Payer: BC Managed Care – PPO | Admitting: Family

## 2012-01-09 VITALS — BP 118/76 | Temp 98.7°F | Wt 236.0 lb

## 2012-01-09 DIAGNOSIS — R059 Cough, unspecified: Secondary | ICD-10-CM

## 2012-01-09 DIAGNOSIS — R05 Cough: Secondary | ICD-10-CM | POA: Diagnosis not present

## 2012-01-09 DIAGNOSIS — J209 Acute bronchitis, unspecified: Secondary | ICD-10-CM

## 2012-01-09 MED ORDER — BECLOMETHASONE DIPROPIONATE 40 MCG/ACT IN AERS: 2.0000 | INHALATION_SPRAY | Freq: Two times a day (BID) | RESPIRATORY_TRACT | Status: DC

## 2012-01-09 MED ORDER — PREDNISONE 20 MG PO TABS: ORAL_TABLET | ORAL | Status: AC

## 2012-01-09 NOTE — Progress Notes (Signed)
Subjective:    Patient ID: Mathew Randolph, male    DOB: [birth year], 66 y.o.   MRN: [id number]  HPI 66 year old white male, former smoker of 20 years, is in today with persistent cough and bronchitis that began 3 months ago. Since then, he has been on amoxicillin, Levaquin, albuterol, and prednisone 20 mg. He continues to have a cough that is productive, sometimes clear and other times yellow. He admits to shortness of breath. He's been taken Robitussin and Mucinex with little to no relief. Patient denies any lightheadedness, dizziness, chest pain, nausea, vomiting, or edema.   Review of Systems  Constitutional: Negative.   HENT: Negative.   Eyes: Negative.   Respiratory: Positive for cough, shortness of breath and wheezing.   Cardiovascular: Negative.  Negative for chest pain, palpitations and leg swelling.  Gastrointestinal: Negative.   Genitourinary: Negative.   Musculoskeletal: Negative.   Skin: Negative.   Neurological: Negative.   Hematological: Negative.   Psychiatric/Behavioral: Negative.    Past Medical History  Diagnosis Date  . A-fib   . Hypertension   . Hyperlipidemia   . GERD (gastroesophageal reflux disease)   . Unspecified disease of pericardium   . Hyperpotassemia   . Acute kidney failure, unspecified   . Systemic inflammatory response syndrome, unspecified   . Acute vascular insufficiency of intestine   . Elevated prostate specific antigen (PSA)   . Personal history of urinary calculi   . Primary hypercoagulable state   . [facility] spotted fever   . Prostate cancer   . Diverticulosis     History   Social History  . Marital Status: Married    Spouse Name: N/A    Number of Children: N/A  . Years of Education: N/A   Occupational History  . Not on file.   Social History Main Topics  . Smoking status: Former Smoker    Quit date: 04/28/1991  . Smokeless tobacco: Not on file  . Alcohol Use: Yes  . Drug Use: No  . Sexually Active: Not on file     Other Topics Concern  . Not on file   Social History Narrative  . No narrative on file    Past Surgical History  Procedure Date  . Knee arthroscopy   . Appendectomy   . Tonsillectomy     No family history on file.  Allergies  Allergen Reactions  . Sulfamethoxazole W/Trimethoprim     REACTION: rash    Current Outpatient Prescriptions on File Prior to Visit  Medication Sig Dispense Refill  . albuterol (PROAIR HFA) 108 (90 BASE) MCG/ACT inhaler Inhale 2 puffs into the lungs every 6 (six) hours as needed.        Marland Kitchen amphetamine-dextroamphetamine (ADDERALL XR, 15MG ,) 15 MG 24 hr capsule Take 1 capsule (15 mg total) by mouth daily.  30 capsule  0  . amphetamine-dextroamphetamine (ADDERALL XR, 15MG ,) 15 MG 24 hr capsule Take 1 capsule (15 mg total) by mouth daily.  30 capsule  0  . amphetamine-dextroamphetamine (ADDERALL XR, 15MG ,) 15 MG 24 hr capsule Take 1 capsule (15 mg total) by mouth daily.  30 capsule  0  . celecoxib (CELEBREX) 200 MG capsule Take 1 capsule (200 mg total) by mouth 2 (two) times daily.  90 capsule  3  . esomeprazole (NEXIUM) 40 MG capsule Take 1 capsule (40 mg total) by mouth daily before breakfast.  90 capsule  3  . glucosamine-chondroitin 500-400 MG tablet Take 1 tablet by mouth 3 (three) times daily.        Marland Kitchen  lisinopril (PRINIVIL,ZESTRIL) 40 MG tablet Take 1 tablet (40 mg total) by mouth daily.  90 tablet  3  . metoprolol (TOPROL-XL) 50 MG 24 hr tablet TAKE ONE AND ONE-HALF TABLET BY MOUTH ONCE DAILY  45 tablet  0  . rosuvastatin (CRESTOR) 10 MG tablet Take 1 tablet (10 mg total) by mouth at bedtime. Take 1/2 daily   90 tablet  3  . sertraline (ZOLOFT) 50 MG tablet Take 1 tablet (50 mg total) by mouth daily.  90 tablet  1  . sildenafil (VIAGRA) 50 MG tablet Take 1 tablet (50 mg total) by mouth daily as needed.  30 tablet  3  . Tamsulosin HCl (FLOMAX) 0.4 MG CAPS Take 0.4 mg by mouth daily.        Marland Kitchen warfarin (COUMADIN) 5 MG tablet Take 1 tablet (5 mg total) by  mouth daily.  90 tablet  3  . HYDROcodone-acetaminophen (NORCO) 10-325 MG per tablet Take 1 tablet by mouth every 6 (six) hours as needed.        . traMADol (ULTRAM) 50 MG tablet Take 50 mg by mouth every 6 (six) hours as needed.         Current Facility-Administered Medications on File Prior to Visit  Medication Dose Route Frequency Provider Last Rate Last Dose  . TDaP (BOOSTRIX) injection 0.5 mL  0.5 mL Intramuscular Once Kristian Covey, MD        BP 118/76  Temp(Src) 98.7 F (37.1 C) (Oral)  Wt 236 lb (107.049 kg)chart    Objective:   Physical Exam  Constitutional: He is oriented to person, place, and time. He appears well-developed and well-nourished.  HENT:  Right Ear: External ear normal.  Left Ear: External ear normal.  Nose: Nose normal.  Mouth/Throat: Oropharynx is clear and moist.  Neck: Normal range of motion. Neck supple.  Cardiovascular: Normal rate, regular rhythm and normal heart sounds.   Pulmonary/Chest: Effort normal. No respiratory distress. He has wheezes. He exhibits tenderness.  Musculoskeletal: Normal range of motion.  Neurological: He is alert and oriented to person, place, and time.  Skin: Skin is warm and dry.  Psychiatric: He has a normal mood and affect.          Assessment & Plan:  Assessment: Chronic bronchitis, cough  Plan: Chest x-ray. Prednisone 60x3, 40x3, 20x3. Qvar 40 mcg 2 puffs twice daily. Patient has an appointment with his PCP in 3 weeks, followup at that point. The belief is that he has COPD. We'll follow the patient and the results of his chest x-ray and sooner when necessary.

## 2012-01-09 NOTE — Telephone Encounter (Signed)
Pt calling to get results of chest x-ray performed today.

## 2012-01-09 NOTE — Patient Instructions (Addendum)
1. Qvar 2 puffs twice daily. 2. Prednisone as directed.  3. CXR   Bronchitis Bronchitis is the body's way of reacting to injury and/or infection (inflammation) of the bronchi. Bronchi are the air tubes that extend from the windpipe into the lungs. If the inflammation becomes severe, it may cause shortness of breath. CAUSES  Inflammation may be caused by:  A virus.   Germs (bacteria).   Dust.   Allergens.   Pollutants and many other irritants.  The cells lining the bronchial tree are covered with tiny hairs (cilia). These constantly beat upward, away from the lungs, toward the mouth. This keeps the lungs free of pollutants. When these cells become too irritated and are unable to do their job, mucus begins to develop. This causes the characteristic cough of bronchitis. The cough clears the lungs when the cilia are unable to do their job. Without either of these protective mechanisms, the mucus would settle in the lungs. Then you would develop pneumonia. Smoking is a common cause of bronchitis and can contribute to pneumonia. Stopping this habit is the single most important thing you can do to help yourself. TREATMENT   Your caregiver may prescribe an antibiotic if the cough is caused by bacteria. Also, medicines that open up your airways make it easier to breathe. Your caregiver may also recommend or prescribe an expectorant. It will loosen the mucus to be coughed up. Only take over-the-counter or prescription medicines for pain, discomfort, or fever as directed by your caregiver.   Removing whatever causes the problem (smoking, for example) is critical to preventing the problem from getting worse.   Cough suppressants may be prescribed for relief of cough symptoms.   Inhaled medicines may be prescribed to help with symptoms now and to help prevent problems from returning.   For those with recurrent (chronic) bronchitis, there may be a need for steroid medicines.  SEEK IMMEDIATE MEDICAL  CARE IF:   During treatment, you develop more pus-like mucus (purulent sputum).   You have a fever.   Your baby is older than 3 months with a rectal temperature of 102 F (38.9 C) or higher.   Your baby is 25 months old or younger with a rectal temperature of 100.4 F (38 C) or higher.   You become progressively more ill.   You have increased difficulty breathing, wheezing, or shortness of breath.  It is necessary to seek immediate medical care if you are elderly or sick from any other disease. MAKE SURE YOU:   Understand these instructions.   Will watch your condition.   Will get help right away if you are not doing well or get worse.  Document Released: 11/21/2005 Document Revised: 08/03/2011 Document Reviewed: 09/30/2008 [facility] Patient Information 2012 [place], [place].

## 2012-01-10 ENCOUNTER — Telehealth: Payer: Self-pay | Admitting: Internal Medicine

## 2012-01-10 ENCOUNTER — Telehealth: Payer: Self-pay

## 2012-01-10 MED ORDER — LEVOFLOXACIN 750 MG PO TABS: 750.0000 mg | ORAL_TABLET | Freq: Every day | ORAL | Status: AC

## 2012-01-10 MED ORDER — AZITHROMYCIN 250 MG PO TABS: ORAL_TABLET | ORAL | Status: DC

## 2012-01-10 NOTE — Telephone Encounter (Signed)
levaquin 750mg  QD x 5 days. No refills

## 2012-01-10 NOTE — Telephone Encounter (Signed)
Message copied by Beverely Low on Tue Jan 10, 2012 10:29 AM ------      Message from: Adline Mango B      Created: Mon Jan 09, 2012  4:22 PM       Possible pneumonia. Otherwise negative CXR, Please call in Zpak as directed. #1 no refills.

## 2012-01-10 NOTE — Telephone Encounter (Signed)
Left detailed message on personally identified voicemail to notify pt 

## 2012-01-10 NOTE — Telephone Encounter (Signed)
done

## 2012-01-10 NOTE — Telephone Encounter (Signed)
Zpak was called in. Patient called in today stating that he does not want the zpak, instead, he wants the Levaquin. [facility] call [facility]. He wanted Padonda to prescribe this. Thanks.

## 2012-01-27 NOTE — Telephone Encounter (Signed)
Pt aware of results and Rx sent to pharmacy on 01/10/12

## 2012-01-30 ENCOUNTER — Encounter (HOSPITAL_BASED_OUTPATIENT_CLINIC_OR_DEPARTMENT_OTHER): Payer: Self-pay | Admitting: *Deleted

## 2012-01-30 ENCOUNTER — Telehealth: Payer: Self-pay | Admitting: Internal Medicine

## 2012-01-30 MED ORDER — BECLOMETHASONE DIPROPIONATE 40 MCG/ACT IN AERS: 2.0000 | INHALATION_SPRAY | Freq: Two times a day (BID) | RESPIRATORY_TRACT | Status: DC

## 2012-01-30 NOTE — Telephone Encounter (Signed)
Pt called req refill of beclomethasone (QVAR) 40 MCG/ACT inhaler to [facility] [address].

## 2012-01-30 NOTE — Telephone Encounter (Signed)
rx sent in electronically 

## 2012-01-30 NOTE — Progress Notes (Signed)
NPO AFTER MN. ARRIVES AT 0600. NEEDS EKG. PT STATES LABS TO DONE AT DR SWORDS OFFICE 01-31-12 AT 0900, WILL CHECK FOR THEM IN PM, PENDING RESULTS , ? PT MAY NEED PT/INR OR ISTAT DOS.  WILL TAKE METOPROLOL , NEXIUM AND DO QVAR INHALER AM OF SURG. W/ SIPS OF WATER.

## 2012-01-31 ENCOUNTER — Other Ambulatory Visit (INDEPENDENT_AMBULATORY_CARE_PROVIDER_SITE_OTHER): Payer: BC Managed Care – PPO

## 2012-01-31 ENCOUNTER — Other Ambulatory Visit: Payer: Self-pay | Admitting: Internal Medicine

## 2012-01-31 ENCOUNTER — Other Ambulatory Visit: Payer: BC Managed Care – PPO

## 2012-01-31 DIAGNOSIS — Z0000 Encounter for general adult medical examination without abnormal findings: Secondary | ICD-10-CM

## 2012-01-31 DIAGNOSIS — F909 Attention-deficit hyperactivity disorder, unspecified type: Secondary | ICD-10-CM

## 2012-01-31 DIAGNOSIS — I4891 Unspecified atrial fibrillation: Secondary | ICD-10-CM

## 2012-01-31 LAB — CBC WITH DIFFERENTIAL/PLATELET
Basophils Absolute: 0 10*3/uL (ref 0.0–0.1)
Eosinophils Absolute: 0.4 10*3/uL (ref 0.0–0.7)
HCT: 41.5 % (ref 39.0–52.0)
Lymphs Abs: 1.2 10*3/uL (ref 0.7–4.0)
MCV: 87.2 fl (ref 78.0–100.0)
Monocytes Absolute: 0.4 10*3/uL (ref 0.1–1.0)
Platelets: 195 10*3/uL (ref 150.0–400.0)
RDW: 14.4 % (ref 11.5–14.6)

## 2012-01-31 LAB — BASIC METABOLIC PANEL
BUN: 17 mg/dL (ref 6–23)
Chloride: 107 mEq/L (ref 96–112)
GFR: 79.4 mL/min (ref >60.00)
Glucose, Bld: 95 mg/dL (ref 70–99)
Potassium: 5.1 mEq/L (ref 3.5–5.1)

## 2012-01-31 LAB — POCT URINALYSIS DIPSTICK
Blood, UA: NEGATIVE
Glucose, UA: NEGATIVE
Protein, UA: NEGATIVE
Spec Grav, UA: 1.025
Urobilinogen, UA: 0.2

## 2012-01-31 LAB — TSH: TSH: 2.53 u[IU]/mL (ref 0.35–5.50)

## 2012-01-31 LAB — APTT: aPTT: 26.8 s (ref 21.7–28.8)

## 2012-01-31 LAB — LIPID PANEL
Cholesterol: 285 mg/dL — ABNORMAL HIGH (ref 0–200)
HDL: 40.4 mg/dL (ref >39.00)
VLDL: 69.2 mg/dL — ABNORMAL HIGH (ref 0.0–40.0)

## 2012-01-31 LAB — HEPATIC FUNCTION PANEL: Total Bilirubin: 0.7 mg/dL (ref 0.3–1.2)

## 2012-01-31 LAB — LDL CHOLESTEROL, DIRECT: Direct LDL: 148.1 mg/dL

## 2012-01-31 LAB — PROTIME-INR: INR: 1.1 ratio — ABNORMAL HIGH (ref 0.8–1.0)

## 2012-01-31 MED ORDER — AMPHETAMINE-DEXTROAMPHET ER 15 MG PO CP24: 15.0000 mg | ORAL_CAPSULE | Freq: Every day | ORAL | Status: DC

## 2012-01-31 NOTE — Telephone Encounter (Signed)
Pt needs new rx generic adderall xr 15 mg °

## 2012-01-31 NOTE — Telephone Encounter (Signed)
Spoke to pharmacist and gave him rx.  Also gave pt 3 mths of adderall

## 2012-01-31 NOTE — Telephone Encounter (Signed)
[facility] [address] and was told that the beclomethasone (QVAR) 40 MCG/ACT inhaler  Was not rcvd. Pt is waiting at the pharmacy. Pls call this med in asap and speak to pharmacist.
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
## 2012-02-02 NOTE — Progress Notes (Signed)
RECEIVED CALL/ MESSAGE FROM DR Vernie Ammons OFFICE, PT/ INR NOT NEEDED DOS . CAN USE PT/INR DONE ON 01-31-12.

## 2012-02-03 ENCOUNTER — Other Ambulatory Visit: Payer: Self-pay

## 2012-02-03 ENCOUNTER — Encounter (HOSPITAL_BASED_OUTPATIENT_CLINIC_OR_DEPARTMENT_OTHER): Payer: Self-pay | Admitting: Anesthesiology

## 2012-02-03 ENCOUNTER — Encounter (HOSPITAL_BASED_OUTPATIENT_CLINIC_OR_DEPARTMENT_OTHER): Payer: Self-pay | Admitting: *Deleted

## 2012-02-03 ENCOUNTER — Encounter (HOSPITAL_BASED_OUTPATIENT_CLINIC_OR_DEPARTMENT_OTHER)
Admission: RE | Disposition: A | Discharge status: Home Health | Payer: Self-pay | Source: Ambulatory Visit | Attending: Urology

## 2012-02-03 ENCOUNTER — Ambulatory Visit (HOSPITAL_BASED_OUTPATIENT_CLINIC_OR_DEPARTMENT_OTHER)
Admission: RE | Admit: 2012-02-03 | Discharge: 2012-02-03 | Disposition: A | Discharge status: Home Health | Payer: BC Managed Care – PPO | Source: Ambulatory Visit | Attending: Urology | Admitting: Urology

## 2012-02-03 ENCOUNTER — Ambulatory Visit (HOSPITAL_BASED_OUTPATIENT_CLINIC_OR_DEPARTMENT_OTHER): Payer: BC Managed Care – PPO | Admitting: Anesthesiology

## 2012-02-03 DIAGNOSIS — C679 Malignant neoplasm of bladder, unspecified: Secondary | ICD-10-CM | POA: Insufficient documentation

## 2012-02-03 DIAGNOSIS — K219 Gastro-esophageal reflux disease without esophagitis: Secondary | ICD-10-CM | POA: Insufficient documentation

## 2012-02-03 DIAGNOSIS — N3090 Cystitis, unspecified without hematuria: Secondary | ICD-10-CM | POA: Diagnosis not present

## 2012-02-03 DIAGNOSIS — C61 Malignant neoplasm of prostate: Secondary | ICD-10-CM | POA: Insufficient documentation

## 2012-02-03 DIAGNOSIS — I4891 Unspecified atrial fibrillation: Secondary | ICD-10-CM | POA: Insufficient documentation

## 2012-02-03 DIAGNOSIS — N3020 Other chronic cystitis without hematuria: Secondary | ICD-10-CM | POA: Insufficient documentation

## 2012-02-03 DIAGNOSIS — D6859 Other primary thrombophilia: Secondary | ICD-10-CM | POA: Insufficient documentation

## 2012-02-03 DIAGNOSIS — Z8551 Personal history of malignant neoplasm of bladder: Secondary | ICD-10-CM | POA: Diagnosis not present

## 2012-02-03 DIAGNOSIS — Z79899 Other long term (current) drug therapy: Secondary | ICD-10-CM | POA: Insufficient documentation

## 2012-02-03 DIAGNOSIS — R319 Hematuria, unspecified: Secondary | ICD-10-CM | POA: Diagnosis not present

## 2012-02-03 DIAGNOSIS — Z7901 Long term (current) use of anticoagulants: Secondary | ICD-10-CM | POA: Insufficient documentation

## 2012-02-03 DIAGNOSIS — N3000 Acute cystitis without hematuria: Secondary | ICD-10-CM | POA: Insufficient documentation

## 2012-02-03 DIAGNOSIS — N3010 Interstitial cystitis (chronic) without hematuria: Secondary | ICD-10-CM | POA: Diagnosis not present

## 2012-02-03 HISTORY — DX: Personal history of other specified conditions: Z87.898

## 2012-02-03 HISTORY — DX: Long term (current) use of anticoagulants: Z79.01

## 2012-02-03 HISTORY — DX: Major depressive disorder, single episode, unspecified: F32.9

## 2012-02-03 HISTORY — DX: Personal history of malignant neoplasm of bladder: Z85.51

## 2012-02-03 HISTORY — DX: Acute embolism and thrombosis of other specified veins: I82.890

## 2012-02-03 HISTORY — DX: Unspecified osteoarthritis, unspecified site: M19.90

## 2012-02-03 HISTORY — DX: Depression, unspecified: F32.A

## 2012-02-03 HISTORY — DX: Personal history of other (healed) physical injury and trauma: Z87.828

## 2012-02-03 HISTORY — DX: Personal history of urinary calculi: Z87.442

## 2012-02-03 HISTORY — PX: CYSTOSCOPY WITH BIOPSY: SHX5122

## 2012-02-03 HISTORY — DX: Nocturia: R35.1

## 2012-02-03 SURGERY — CYSTOSCOPY, WITH BIOPSY
Anesthesia: General | Site: Bladder | Wound class: Clean Contaminated

## 2012-02-03 MED ORDER — PROPOFOL 10 MG/ML IV EMUL
INTRAVENOUS | Status: DC | PRN
  Administered 2012-02-03: 200 mg via INTRAVENOUS

## 2012-02-03 MED ORDER — LIDOCAINE HCL (CARDIAC) 20 MG/ML IV SOLN
INTRAVENOUS | Status: DC | PRN
  Administered 2012-02-03: 80 mg via INTRAVENOUS

## 2012-02-03 MED ORDER — CIPROFLOXACIN IN D5W 200 MG/100ML IV SOLN
200.0000 mg | INTRAVENOUS | Status: AC
  Administered 2012-02-03: 200 mg via INTRAVENOUS

## 2012-02-03 MED ORDER — FENTANYL CITRATE 0.05 MG/ML IJ SOLN: 25.0000 ug | INTRAMUSCULAR | Status: DC | PRN

## 2012-02-03 MED ORDER — PHENAZOPYRIDINE HCL 200 MG PO TABS: 200.0000 mg | ORAL_TABLET | Freq: Three times a day (TID) | ORAL | Status: AC | PRN

## 2012-02-03 MED ORDER — ONDANSETRON HCL 4 MG/2ML IJ SOLN
INTRAMUSCULAR | Status: DC | PRN
  Administered 2012-02-03: 4 mg via INTRAVENOUS

## 2012-02-03 MED ORDER — DEXAMETHASONE SODIUM PHOSPHATE 4 MG/ML IJ SOLN
INTRAMUSCULAR | Status: DC | PRN
  Administered 2012-02-03: 8 mg via INTRAVENOUS

## 2012-02-03 MED ORDER — LACTATED RINGERS IV SOLN
INTRAVENOUS | Status: DC
  Administered 2012-02-03 (×2): via INTRAVENOUS

## 2012-02-03 MED ORDER — HYDROCODONE-ACETAMINOPHEN 7.5-325 MG PO TABS: 1.0000 | ORAL_TABLET | ORAL | Status: DC | PRN

## 2012-02-03 MED ORDER — HYDROCODONE-ACETAMINOPHEN 5-325 MG PO TABS
1.0000 | ORAL_TABLET | ORAL | Status: DC | PRN
  Administered 2012-02-03: 1 via ORAL

## 2012-02-03 MED ORDER — FENTANYL CITRATE 0.05 MG/ML IJ SOLN
INTRAMUSCULAR | Status: DC | PRN
  Administered 2012-02-03: 25 ug via INTRAVENOUS
  Administered 2012-02-03: 50 ug via INTRAVENOUS
  Administered 2012-02-03: 25 ug via INTRAVENOUS
  Administered 2012-02-03: 50 ug via INTRAVENOUS

## 2012-02-03 MED ORDER — PHENAZOPYRIDINE HCL 200 MG PO TABS
200.0000 mg | ORAL_TABLET | Freq: Once | ORAL | Status: AC
  Administered 2012-02-03: 200 mg via ORAL

## 2012-02-03 MED ORDER — STERILE WATER FOR IRRIGATION IR SOLN
Status: DC | PRN
  Administered 2012-02-03: 3000 mL

## 2012-02-03 SURGICAL SUPPLY — 18 items
BAG DRAIN URO-CYSTO SKYTR STRL (DRAIN) ×2 IMPLANT
BAG DRN UROCATH (DRAIN) ×1
CANISTER SUCT LVC 12 LTR MEDI- (MISCELLANEOUS) ×1 IMPLANT
CLOTH BEACON ORANGE TIMEOUT ST (SAFETY) ×2 IMPLANT
DRAPE CAMERA CLOSED 9X96 (DRAPES) ×2 IMPLANT
ELECT REM PT RETURN 9FT ADLT (ELECTROSURGICAL) ×2
ELECTRODE REM PT RTRN 9FT ADLT (ELECTROSURGICAL) ×1 IMPLANT
GLOVE BIO SURGEON STRL SZ 6.5 (GLOVE) ×2 IMPLANT
GLOVE BIO SURGEON STRL SZ8 (GLOVE) ×2 IMPLANT
GLOVE INDICATOR 6.5 STRL GRN (GLOVE) ×1 IMPLANT
GLOVE INDICATOR 7.0 STRL GRN (GLOVE) ×1 IMPLANT
GOWN PREVENTION PLUS LG XLONG (DISPOSABLE) ×3 IMPLANT
GOWN STRL REIN XL XLG (GOWN DISPOSABLE) ×2 IMPLANT
IV NS IRRIG 3000ML ARTHROMATIC (IV SOLUTION) IMPLANT
NEEDLE HYPO 22GX1.5 SAFETY (NEEDLE) IMPLANT
NS IRRIG 500ML POUR BTL (IV SOLUTION) ×1 IMPLANT
PACK CYSTOSCOPY (CUSTOM PROCEDURE TRAY) ×2 IMPLANT
WATER STERILE IRR 3000ML UROMA (IV SOLUTION) ×2 IMPLANT

## 2012-02-03 NOTE — Anesthesia Postprocedure Evaluation (Signed)
  Anesthesia Post-op Note  Patient: Mathew Randolph  Procedure(s) Performed: Procedure(s) (LRB): CYSTOSCOPY WITH BIOPSY (N/A)  Patient Location: PACU  Anesthesia Type: General  Level of Consciousness: awake and alert   Airway and Oxygen Therapy: Patient Spontanous Breathing  Post-op Pain: mild  Post-op Assessment: Post-op Vital signs reviewed, Patient's Cardiovascular Status Stable, Respiratory Function Stable, Patent Airway and No signs of Nausea or vomiting  Post-op Vital Signs: stable  Complications: No apparent anesthesia complications

## 2012-02-03 NOTE — Transfer of Care (Signed)
Immediate Anesthesia Transfer of Care Note  Patient: Mathew Randolph  Procedure(s) Performed: Procedure(s) (LRB): CYSTOSCOPY WITH BIOPSY (N/A)  Patient Location: Patient transported to PACU with oxygen via face mask at 4 Liters / Min  Anesthesia Type: General  Level of Consciousness: awake and alert   Airway & Oxygen Therapy: Patient Spontanous Breathing and Patient connected to face mask oxygen  Post-op Assessment: Report given to PACU RN and Post -op Vital signs reviewed and stable  Post vital signs: Reviewed and stable  Dentition: Teeth and oropharynx remain in pre-op condition  Complications: No apparent anesthesia complications

## 2012-02-03 NOTE — Anesthesia Procedure Notes (Signed)
Procedure Name: LMA Insertion Date/Time: 02/03/2012 7:32 AM Performed by: Lorrin Jackson Pre-anesthesia Checklist: Patient identified, Emergency Drugs available, Suction available and Patient being monitored Patient Re-evaluated:Patient Re-evaluated prior to inductionOxygen Delivery Method: Circle System Utilized Preoxygenation: Pre-oxygenation with 100% oxygen Intubation Type: IV induction Ventilation: Mask ventilation without difficulty LMA: LMA with gastric port inserted LMA Size: 4.0 Number of attempts: 1 Placement Confirmation: positive ETCO2 Tube secured with: Tape Dental Injury: Teeth and Oropharynx as per pre-operative assessment

## 2012-02-03 NOTE — Op Note (Signed)
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 Cold cup biopsies from: Anterior wall, posterior wall, left wall and wall of the diverticulum. 2. Barbotage urine sent for cytology  DISPOSITION OF SPECIMEN:  PATHOLOGY  Indication: Mr. Knoble is a 66 year old male patient who has undergone radiation therapy for prostate cancer. He then developed gross hematuria and was found to have a solitary tumor at the dome of the bladder that was resected on 09/19/11. 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 He therefore underwent a six-week induction course of BCG and now returns to the OR for repeat bladder biopsy.  Description of operation: The patient was taken to the operating room and administered general anesthesia. He was then placed on the table and moved to the dorsal lithotomy position after which his genitalia was sterilely prepped and draped. An official timeout was then performed.  The 22 French cystoscope was passed under direct vision down the urethra which was noted be normal. The prostatic urethra revealed mild bilobar hypertrophy with a relatively high bladder neck. Upon entering the bladder the bladder was fully and systematically inspected. Ureteral orifices were noted to be of normal configuration and position. A large, widemouthed diverticulum was noted on the right posterior wall and it was inspected and noted be free of any lesions. An area of previous resection near the dome of the bladder and more anteriorly located was identified. There were some mildly erythematous areas on the posterior wall of the bladder extending up toward the dome.  When I initially  introduced the cystoscope and performed his initial inspection his bladder was partially filled with urine. I therefore used the Toomey syringe and performed barbotage of the bladder. This was sent for cytology. I then introduced the cold cup biopsy forceps and obtained cold cup biopsies from the above noted locations. The Bugbee electrode was then passed into the bladder and each of the cold cup biopsy sites were fulgurated. No bleeding was noted at the end of the procedure and therefore the bladder was drained, the cystoscope removed and the patient was awakened and taken to recovery room. He tolerated the procedure well with no intraoperative complications.  PLAN OF CARE: Discharge to home after PACU  PATIENT DISPOSITION:  PACU - hemodynamically stable.
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
## 2012-02-06 ENCOUNTER — Ambulatory Visit (INDEPENDENT_AMBULATORY_CARE_PROVIDER_SITE_OTHER): Payer: BC Managed Care – PPO | Admitting: Internal Medicine

## 2012-02-06 ENCOUNTER — Encounter: Payer: Self-pay | Admitting: Internal Medicine

## 2012-02-06 VITALS — BP 110/72 | HR 76 | Temp 98.2°F | Ht 71.0 in | Wt 241.0 lb

## 2012-02-06 DIAGNOSIS — Z0000 Encounter for general adult medical examination without abnormal findings: Secondary | ICD-10-CM | POA: Diagnosis not present

## 2012-02-06 MED ORDER — LOSARTAN POTASSIUM 100 MG PO TABS: 100.0000 mg | ORAL_TABLET | Freq: Every day | ORAL | Status: DC
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
## 2012-02-06 NOTE — Patient Instructions (Signed)
Monthly protimes 

## 2012-02-08 DIAGNOSIS — D090 Carcinoma in situ of bladder: Secondary | ICD-10-CM | POA: Diagnosis not present

## 2012-02-20 ENCOUNTER — Other Ambulatory Visit: Payer: Self-pay

## 2012-02-20 MED ORDER — LOSARTAN POTASSIUM 100 MG PO TABS: 100.0000 mg | ORAL_TABLET | Freq: Every day | ORAL | Status: DC

## 2012-02-20 NOTE — Telephone Encounter (Signed)
Rx sent to [facility]

## 2012-03-27 DIAGNOSIS — N529 Male erectile dysfunction, unspecified: Secondary | ICD-10-CM | POA: Diagnosis not present

## 2012-03-30 ENCOUNTER — Telehealth: Payer: Self-pay | Admitting: *Deleted

## 2012-03-30 NOTE — Telephone Encounter (Signed)
Please call pharmacy [facility] [place], [place]) re:  Questions on prescription that was sent in on Adderal.

## 2012-04-03 NOTE — Telephone Encounter (Signed)
They were wanting to make sure that Dr Cato Mulligan wrote a rx in [place].  Confirmed that we did

## 2012-04-05 ENCOUNTER — Other Ambulatory Visit: Payer: Self-pay | Admitting: Internal Medicine

## 2012-04-05 DIAGNOSIS — N529 Male erectile dysfunction, unspecified: Secondary | ICD-10-CM | POA: Diagnosis not present

## 2012-04-26 ENCOUNTER — Encounter: Payer: Self-pay | Admitting: *Deleted

## 2012-05-22 ENCOUNTER — Other Ambulatory Visit: Payer: Self-pay | Admitting: Urology

## 2012-05-28 DIAGNOSIS — D090 Carcinoma in situ of bladder: Secondary | ICD-10-CM | POA: Diagnosis not present

## 2012-06-04 DIAGNOSIS — D090 Carcinoma in situ of bladder: Secondary | ICD-10-CM | POA: Diagnosis not present

## 2012-06-14 ENCOUNTER — Telehealth: Payer: Self-pay | Admitting: Internal Medicine

## 2012-06-14 NOTE — Telephone Encounter (Signed)
Pt called and is in town until 5pm and then his is going to be out of town again. Pt is needing to get a refill for 3 scripts ofamphetamine-dextroamphetamine (ADDERALL XR, 15MG ,) 15 MG 24 hr capsule. Pt req to pick up script today if possible, if not he can pick up script on Wed 06/20/12.

## 2012-06-15 ENCOUNTER — Encounter (HOSPITAL_BASED_OUTPATIENT_CLINIC_OR_DEPARTMENT_OTHER): Payer: Self-pay | Admitting: *Deleted

## 2012-06-15 MED ORDER — AMPHETAMINE-DEXTROAMPHET ER 15 MG PO CP24: 15.0000 mg | ORAL_CAPSULE | Freq: Every day | ORAL | Status: DC

## 2012-06-15 NOTE — Telephone Encounter (Signed)
rx up front ready for p/u, pt aware 

## 2012-06-15 NOTE — Progress Notes (Signed)
NPO AFTER MN. ARRIVES AT 0915. ON ARRIVAL NEEDS PT/INR, BMET, AND HG. (PT IS OUT OF TOWN UNTIL LATE 06-21-2012) CURRENT EKG W/ CHART. WILL TAKE NEXIUM, TOPROL, AND DO QVAR INHALER AM OF SURG W/ SIPS OF WATER. WILL DO HIBICLENS SHOWER HS AND AM OF SURG. RCC GUIDELINES REVIEWED , WILL BRING MEDS.
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
## 2012-06-22 ENCOUNTER — Encounter (HOSPITAL_BASED_OUTPATIENT_CLINIC_OR_DEPARTMENT_OTHER): Payer: Self-pay | Admitting: *Deleted

## 2012-06-22 ENCOUNTER — Encounter (HOSPITAL_BASED_OUTPATIENT_CLINIC_OR_DEPARTMENT_OTHER): Payer: Self-pay | Admitting: Anesthesiology

## 2012-06-22 ENCOUNTER — Encounter (HOSPITAL_BASED_OUTPATIENT_CLINIC_OR_DEPARTMENT_OTHER)
Admission: RE | Disposition: A | Discharge status: Home / Self Care | Payer: Self-pay | Source: Ambulatory Visit | Attending: Urology

## 2012-06-22 ENCOUNTER — Ambulatory Visit (HOSPITAL_BASED_OUTPATIENT_CLINIC_OR_DEPARTMENT_OTHER): Payer: BC Managed Care – PPO | Admitting: Anesthesiology

## 2012-06-22 ENCOUNTER — Ambulatory Visit (HOSPITAL_BASED_OUTPATIENT_CLINIC_OR_DEPARTMENT_OTHER)
Admission: RE | Admit: 2012-06-22 | Discharge: 2012-06-23 | Disposition: A | Discharge status: Home / Self Care | Payer: BC Managed Care – PPO | Source: Ambulatory Visit | Attending: Urology | Admitting: Urology

## 2012-06-22 DIAGNOSIS — Z8546 Personal history of malignant neoplasm of prostate: Secondary | ICD-10-CM | POA: Diagnosis not present

## 2012-06-22 DIAGNOSIS — D090 Carcinoma in situ of bladder: Secondary | ICD-10-CM | POA: Insufficient documentation

## 2012-06-22 DIAGNOSIS — K219 Gastro-esophageal reflux disease without esophagitis: Secondary | ICD-10-CM | POA: Insufficient documentation

## 2012-06-22 DIAGNOSIS — Y842 Radiological procedure and radiotherapy as the cause of abnormal reaction of the patient, or of later complication, without mention of misadventure at the time of the procedure: Secondary | ICD-10-CM | POA: Insufficient documentation

## 2012-06-22 DIAGNOSIS — N529 Male erectile dysfunction, unspecified: Secondary | ICD-10-CM | POA: Diagnosis present

## 2012-06-22 DIAGNOSIS — I10 Essential (primary) hypertension: Secondary | ICD-10-CM | POA: Insufficient documentation

## 2012-06-22 DIAGNOSIS — T66XXXS Radiation sickness, unspecified, sequela: Secondary | ICD-10-CM | POA: Insufficient documentation

## 2012-06-22 DIAGNOSIS — C61 Malignant neoplasm of prostate: Secondary | ICD-10-CM | POA: Insufficient documentation

## 2012-06-22 DIAGNOSIS — Z79899 Other long term (current) drug therapy: Secondary | ICD-10-CM | POA: Insufficient documentation

## 2012-06-22 HISTORY — DX: Male erectile dysfunction, unspecified: N52.9

## 2012-06-22 HISTORY — PX: PENILE PROSTHESIS IMPLANT: SHX240

## 2012-06-22 HISTORY — DX: Personal history of malignant neoplasm of prostate: Z85.46

## 2012-06-22 LAB — HEMOGLOBIN: Hemoglobin: 14 g/dL (ref 13.0–17.0)

## 2012-06-22 LAB — BASIC METABOLIC PANEL
BUN: 14 mg/dL (ref 6–23)
Chloride: 108 mEq/L (ref 96–112)
GFR calc Af Amer: 86 mL/min — ABNORMAL LOW (ref >90)
Potassium: 4.1 mEq/L (ref 3.5–5.1)
Sodium: 139 mEq/L (ref 135–145)

## 2012-06-22 LAB — PROTIME-INR: Prothrombin Time: 13.5 seconds (ref 11.6–15.2)

## 2012-06-22 SURGERY — INSERTION, PENILE PROSTHESIS
Anesthesia: General | Site: Penis | Wound class: Clean

## 2012-06-22 MED ORDER — ACETAMINOPHEN 10 MG/ML IV SOLN
1000.0000 mg | Freq: Four times a day (QID) | INTRAVENOUS | Status: DC
  Administered 2012-06-22 – 2012-06-23 (×3): 1000 mg via INTRAVENOUS

## 2012-06-22 MED ORDER — ZOLPIDEM TARTRATE 5 MG PO TABS: 5.0000 mg | ORAL_TABLET | Freq: Every evening | ORAL | Status: DC | PRN

## 2012-06-22 MED ORDER — ACETAMINOPHEN 325 MG PO TABS: 650.0000 mg | ORAL_TABLET | ORAL | Status: DC | PRN

## 2012-06-22 MED ORDER — FENTANYL CITRATE 0.05 MG/ML IJ SOLN
INTRAMUSCULAR | Status: DC | PRN
  Administered 2012-06-22: 25 ug via INTRAVENOUS
  Administered 2012-06-22 (×2): 50 ug via INTRAVENOUS
  Administered 2012-06-22 (×5): 25 ug via INTRAVENOUS

## 2012-06-22 MED ORDER — OXYCODONE-ACETAMINOPHEN 10-325 MG PO TABS: 1.0000 | ORAL_TABLET | ORAL | Status: AC | PRN

## 2012-06-22 MED ORDER — EPHEDRINE SULFATE 50 MG/ML IJ SOLN
INTRAMUSCULAR | Status: DC | PRN
  Administered 2012-06-22 (×2): 10 mg via INTRAVENOUS
  Administered 2012-06-22: 5 mg via INTRAVENOUS
  Administered 2012-06-22: 10 mg via INTRAVENOUS

## 2012-06-22 MED ORDER — CIPROFLOXACIN HCL 500 MG PO TABS: 500.0000 mg | ORAL_TABLET | Freq: Two times a day (BID) | ORAL | Status: AC

## 2012-06-22 MED ORDER — LIDOCAINE HCL (CARDIAC) 20 MG/ML IV SOLN
INTRAVENOUS | Status: DC | PRN
  Administered 2012-06-22: 80 mg via INTRAVENOUS

## 2012-06-22 MED ORDER — LACTATED RINGERS IV SOLN
INTRAVENOUS | Status: DC
  Administered 2012-06-22: 100 mL/h via INTRAVENOUS
  Administered 2012-06-22: 12:00:00 via INTRAVENOUS

## 2012-06-22 MED ORDER — SODIUM CHLORIDE 0.9 % IR SOLN
Status: DC | PRN
  Administered 2012-06-22 (×2)

## 2012-06-22 MED ORDER — MIDAZOLAM HCL 5 MG/5ML IJ SOLN
INTRAMUSCULAR | Status: DC | PRN
  Administered 2012-06-22: 2 mg via INTRAVENOUS

## 2012-06-22 MED ORDER — FENTANYL CITRATE 0.05 MG/ML IJ SOLN
25.0000 ug | INTRAMUSCULAR | Status: DC | PRN
  Administered 2012-06-22 (×4): 25 ug via INTRAVENOUS

## 2012-06-22 MED ORDER — CEFAZOLIN SODIUM 1-5 GM-% IV SOLN
1.0000 g | Freq: Three times a day (TID) | INTRAVENOUS | Status: AC
  Administered 2012-06-22 (×2): 1 g via INTRAVENOUS

## 2012-06-22 MED ORDER — ONDANSETRON HCL 4 MG/2ML IJ SOLN: 4.0000 mg | INTRAMUSCULAR | Status: DC | PRN

## 2012-06-22 MED ORDER — SODIUM CHLORIDE 0.9 % IV SOLN
INTRAVENOUS | Status: DC
  Administered 2012-06-22 – 2012-06-23 (×2): via INTRAVENOUS

## 2012-06-22 MED ORDER — HYDROMORPHONE HCL PF 1 MG/ML IJ SOLN: 0.5000 mg | INTRAMUSCULAR | Status: DC | PRN

## 2012-06-22 MED ORDER — OXYCODONE HCL 5 MG PO TABS
5.0000 mg | ORAL_TABLET | ORAL | Status: DC | PRN
  Administered 2012-06-22 (×2): 5 mg via ORAL

## 2012-06-22 MED ORDER — HYDROMORPHONE HCL PF 1 MG/ML IJ SOLN
0.2500 mg | INTRAMUSCULAR | Status: DC | PRN
  Administered 2012-06-22: 0.5 mg via INTRAVENOUS

## 2012-06-22 MED ORDER — GENTAMICIN SULFATE 40 MG/ML IJ SOLN
120.0000 mg | INTRAVENOUS | Status: AC
  Administered 2012-06-22: 120 mg via INTRAVENOUS

## 2012-06-22 MED ORDER — PROPOFOL 10 MG/ML IV EMUL
INTRAVENOUS | Status: DC | PRN
  Administered 2012-06-22: 180 mg via INTRAVENOUS

## 2012-06-22 MED ORDER — CEFAZOLIN SODIUM 1-5 GM-% IV SOLN: 1.0000 g | INTRAVENOUS | Status: DC

## 2012-06-22 MED ORDER — PROMETHAZINE HCL 25 MG/ML IJ SOLN: 6.2500 mg | INTRAMUSCULAR | Status: DC | PRN

## 2012-06-22 MED ORDER — ONDANSETRON HCL 4 MG/2ML IJ SOLN
INTRAMUSCULAR | Status: DC | PRN
  Administered 2012-06-22: 4 mg via INTRAVENOUS

## 2012-06-22 MED ORDER — SODIUM CHLORIDE 0.9 % IR SOLN
Status: DC | PRN
  Administered 2012-06-22: 500 mL

## 2012-06-22 MED ORDER — DEXAMETHASONE SODIUM PHOSPHATE 4 MG/ML IJ SOLN
INTRAMUSCULAR | Status: DC | PRN
  Administered 2012-06-22: 8 mg via INTRAVENOUS

## 2012-06-22 MED ORDER — CHLORHEXIDINE GLUCONATE 4 % EX LIQD: Freq: Once | CUTANEOUS | Status: DC

## 2012-06-22 MED ORDER — CEFAZOLIN SODIUM-DEXTROSE 2-3 GM-% IV SOLR
2.0000 g | INTRAVENOUS | Status: AC
  Administered 2012-06-22: 2 g via INTRAVENOUS

## 2012-06-22 MED ORDER — LACTATED RINGERS IV SOLN: INTRAVENOUS | Status: DC

## 2012-06-22 SURGICAL SUPPLY — 72 items
ADH SKN CLS APL DERMABOND .7 (GAUZE/BANDAGES/DRESSINGS)
APL SKNCLS STERI-STRIP NONHPOA (GAUZE/BANDAGES/DRESSINGS)
APPLICATOR COTTON TIP 6IN STRL (MISCELLANEOUS) IMPLANT
BAG DECANTER FOR FLEXI CONT (MISCELLANEOUS) ×1 IMPLANT
BAG URINE DRAINAGE (UROLOGICAL SUPPLIES) ×1 IMPLANT
BANDAGE CO FLEX L/F 2IN X 5YD (GAUZE/BANDAGES/DRESSINGS) IMPLANT
BANDAGE GAUZE ELAST BULKY 4 IN (GAUZE/BANDAGES/DRESSINGS) ×2 IMPLANT
BENZOIN TINCTURE PRP APPL 2/3 (GAUZE/BANDAGES/DRESSINGS) ×1 IMPLANT
BLADE HEX COATED 2.75 (ELECTRODE) ×2 IMPLANT
BLADE SURG 10 STRL SS (BLADE) IMPLANT
BLADE SURG 15 STRL LF DISP TIS (BLADE) ×2 IMPLANT
BLADE SURG 15 STRL SS (BLADE) ×4
BLADE SURG ROTATE 9660 (MISCELLANEOUS) ×1 IMPLANT
CANISTER SUCTION 1200CC (MISCELLANEOUS) ×1 IMPLANT
CANISTER SUCTION 2500CC (MISCELLANEOUS) IMPLANT
CATH FOLEY 2WAY SLVR  5CC 16FR (CATHETERS) ×1
CATH FOLEY 2WAY SLVR 5CC 16FR (CATHETERS) ×1 IMPLANT
CHLORAPREP W/TINT 26ML (MISCELLANEOUS) ×2 IMPLANT
CLOTH BEACON ORANGE TIMEOUT ST (SAFETY) ×2 IMPLANT
COVER MAYO STAND STRL (DRAPES) ×4 IMPLANT
COVER TABLE BACK 60X90 (DRAPES) ×2 IMPLANT
DERMABOND ADVANCED (GAUZE/BANDAGES/DRESSINGS)
DERMABOND ADVANCED .7 DNX12 (GAUZE/BANDAGES/DRESSINGS) IMPLANT
DISSECTOR ROUND CHERRY 3/8 STR (MISCELLANEOUS) IMPLANT
DRAIN PENROSE 18X1/4 LTX STRL (WOUND CARE) IMPLANT
DRAPE EXTREMITY T 121X128X90 (DRAPE) ×2 IMPLANT
DRAPE LAPAROTOMY TRNSV 102X78 (DRAPE) IMPLANT
DRSG TEGADERM 4X4.75 (GAUZE/BANDAGES/DRESSINGS) ×2 IMPLANT
ELECT REM PT RETURN 9FT ADLT (ELECTROSURGICAL) ×2
ELECTRODE REM PT RTRN 9FT ADLT (ELECTROSURGICAL) ×1 IMPLANT
GAUZE SPONGE 4X4 12PLY STRL LF (GAUZE/BANDAGES/DRESSINGS) ×2 IMPLANT
GLOVE BIO SURGEON STRL SZ7.5 (GLOVE) ×1 IMPLANT
GLOVE BIO SURGEON STRL SZ8 (GLOVE) ×2 IMPLANT
GLOVE BIOGEL M 6.5 STRL (GLOVE) ×2 IMPLANT
GLOVE BIOGEL M STRL SZ7.5 (GLOVE) ×2 IMPLANT
GLOVE BIOGEL PI IND STRL 7.5 (GLOVE) IMPLANT
GLOVE BIOGEL PI INDICATOR 7.5 (GLOVE) ×2
GLOVE ECLIPSE 6.0 STRL STRAW (GLOVE) ×1 IMPLANT
GOWN PREVENTION PLUS LG XLONG (DISPOSABLE) ×2 IMPLANT
GOWN STRL REIN XL XLG (GOWN DISPOSABLE) ×2 IMPLANT
HOLDER FOLEY CATH W/STRAP (MISCELLANEOUS) ×2 IMPLANT
NS IRRIG 500ML POUR BTL (IV SOLUTION) ×2 IMPLANT
PACK BASIN DAY SURGERY FS (CUSTOM PROCEDURE TRAY) ×2 IMPLANT
PENCIL BUTTON HOLSTER BLD 10FT (ELECTRODE) ×2 IMPLANT
PLUG CATH AND CAP STER (CATHETERS) ×2 IMPLANT
RESERVOIR TITAN 12.5CC W/VLV ×1 IMPLANT
RETRACTOR WILSON SYSTEM (INSTRUMENTS) ×2 IMPLANT
SPONGE GAUZE 4X4 12PLY (GAUZE/BANDAGES/DRESSINGS) ×1 IMPLANT
SPONGE LAP 4X18 X RAY DECT (DISPOSABLE) ×4 IMPLANT
STRIP CLOSURE SKIN 1/2X4 (GAUZE/BANDAGES/DRESSINGS) IMPLANT
SUPPORT SCROTAL LG STRP (MISCELLANEOUS) IMPLANT
SURGILUBE 2OZ TUBE FLIPTOP (MISCELLANEOUS) ×2 IMPLANT
SUT CHROMIC 3 0 SH 27 (SUTURE) ×5 IMPLANT
SUT MNCRL AB 4-0 PS2 18 (SUTURE) ×2 IMPLANT
SUT PDS AB 2-0 CT2 27 (SUTURE) ×14 IMPLANT
SUT VIC AB 2-0 UR6 27 (SUTURE) IMPLANT
SUT VIC AB 3-0 SH 27 (SUTURE)
SUT VIC AB 3-0 SH 27X BRD (SUTURE) IMPLANT
SUT VICRYL 0 UR6 27IN ABS (SUTURE) IMPLANT
SUT VICRYL 4-0 PS2 18IN ABS (SUTURE) IMPLANT
SYR 20CC LL (SYRINGE) ×2 IMPLANT
SYR 50ML LL SCALE MARK (SYRINGE) ×4 IMPLANT
SYR BULB IRRIGATION 50ML (SYRINGE) ×2 IMPLANT
SYRINGE 10CC LL (SYRINGE) ×4 IMPLANT
TITAN OTR SCROT ZERO ANG 22CM (Miscellaneous) ×1 IMPLANT
TOWEL OR 17X24 6PK STRL BLUE (TOWEL DISPOSABLE) ×4 IMPLANT
TRAY DSU PREP LF (CUSTOM PROCEDURE TRAY) ×2 IMPLANT
TUBE CONNECTING 12X1/4 (SUCTIONS) ×2 IMPLANT
WATER STERILE IRR 3000ML UROMA (IV SOLUTION) IMPLANT
WATER STERILE IRR 500ML POUR (IV SOLUTION) ×2 IMPLANT
YANKAUER SUCT BULB TIP NO VENT (SUCTIONS) ×2 IMPLANT
titan assembly kit ×1 IMPLANT

## 2012-06-22 NOTE — Anesthesia Procedure Notes (Signed)
Procedure Name: LMA Insertion Date/Time: 06/22/2012 11:03 AM Performed by: Renella Cunas D Pre-anesthesia Checklist: Patient identified, Emergency Drugs available, Suction available and Patient being monitored Patient Re-evaluated:Patient Re-evaluated prior to inductionOxygen Delivery Method: Circle System Utilized Preoxygenation: Pre-oxygenation with 100% oxygen Intubation Type: IV induction Ventilation: Mask ventilation without difficulty LMA: LMA inserted LMA Size: 4.0 Number of attempts: 1 Airway Equipment and Method: bite block Placement Confirmation: positive ETCO2 Tube secured with: Tape Dental Injury: Teeth and Oropharynx as per pre-operative assessment

## 2012-06-22 NOTE — Op Note (Signed)
PATIENT:  Mathew Randolph  PRE-OPERATIVE DIAGNOSIS:  Organic erectile dysfunction  POST-OPERATIVE DIAGNOSIS:  Same  PROCEDURE:  Procedure(s): 3 piece inflatable prosthesis (Coloplast Titan OTR)  SURGEON:  Garnett Farm  Device implanted: 22 cm cylinders with 2 cm rear-tip extenders bilaterally and a 125 cc reservoir.  INDICATION: Mathew Randolph is a 66 year old male with a history of organic erectile dysfunction primarily secondary to the effects of radiation for prostate cancer. We have tried various forms of therapy unsuccessfully and he has elected to proceed with a penile implant.  ANESTHESIA:  General  EBL:  Minimal   Description of procedure: The patient was taken to the major operating room, placed on the table and administered general anesthesia in the supine position. His genitalia was then scrubbed for 10 minutes, painted and then sterilely draped. An official timeout was then performed.  A 16 French Foley catheter was placed in the bladder and the bladder was drained and the catheter was plugged. A midline median raphae scrotal incision was then made and then blunt dissection was used to expose the corpus cavernosum on the right and left sides. This was further cleared of overlying tissue using sharp technique. 2-0 PDS sutures were then placed in the corpus cavernosum to service stay sutures and then an incision was then made in the corpus cavernosum first on the left-hand side with the knife and then extended proximally and distally with the curved Mayo scissors. I then dilated the corpus cavernosum with the Hegar dilators starting at 10 and progressing to 13 proximally and distally. I then irrigated the corpus cavernosum with antibiotic solution and measured the distance proximally and distally from the stay suture and was found to be 13 cm distally and 10 cm proximally. I then turned my attention to the contralateral corpus cavernosum and placed my stay sutures, made my corporotomy  and dilated the corpus cavernosum in an identical fashion. This was measured and also was found to be 13 cm distally and 11 cm proximally. It was irrigated with anastomotic solution as was the scrotum. I then chose an 22 cm cylinder set with 2 cm rear-tip extenders and these were prepped while I prepared the site for reservoir placement.  I used blunt technique to dissect up to the external inguinal ring on the right-hand side. I found the external ring obliterated. Her therefore turned my attention to the left external inguinal ring and dissected bluntly to this location easily noted the ring. I swept the spermatic cord laterally and then was able to bluntly dissect with my index finger through the floor of the inguinal ring. I drained the bladder completely with a Foley catheter and then developed a space behind the symphysis pubis for the reservoir. I irrigated the space with anastomotic solution and then placed the reservoir in this location. I then filled the reservoir with 120 cc of sterile saline.  Attention was redirected to the corporotomies where the cylinders were then placed by first fixing the suture to the distal aspect of the right cylinder to a straight needle. This was then loaded on the [facility] inserter and passed through the corporotomy and distally. I then advanced the straight needle with the Furlow inserter out through the glans and this was grasped with a hemostat and pulled through the glans and the suture was secured with a hemostat. I then performed an identical maneuver on the contralateral side. After this was performed I irrigated both corpus cavernosum and inserted the distal portion of the cylinder  through the corporotomies and pulled this to the end of the corpora with the suture. The proximal aspect with the rear-tip extender was then passed through the corporotomy and into the seated position on each side. I then connected reservoir tubing to a syringe filled with sterile saline  and inflated the device. I noted a good straight erection with both cylinders equidistant under the glans and no buckling of the cylinders. I therefore deflated the device and closed the corporotomies with running 2-0 PDS suture. The cylinder was then connected to the pump after excising the excess tubing with appropriate shodded hemostats in place and then I used the supplied connectors to make the connection. I then again cycled the device with the pump and it cycled properly. I deflated the device and pumped it up about three quarters of the way to aid with hemostasis. I irrigated the scrotum once again with antibiotic solution.  I then developed the scrotal pocket in the right hemiscrotum in place the pump in this location. I irrigated the wound one last time with antibiotic irrigation and then closed the deep scrotal tissue over the tubing and pump with running 3-0 chromic suture. A second layer was then closed over this first layer with running 3-0 chromic and then the skin was closed with running 3-0 chromic. Antibiotic ointment and a 4 x 4 gauze were applied. I then wrapped the scrotum and penis with a Curlex. The catheter was connected to closed system drainage and the patient was awakened and taken recovery room in stable and satisfactory condition. He tolerated the procedure well and there were no intraoperative complications. Needle sponge and instrument counts were correct at the end of the operation.  PLAN OF CARE: He will be observed overnight with intravenous antibiotics and pain medication with plan for discharge in the a.m.   PATIENT DISPOSITION:  PACU - hemodynamically stable.

## 2012-06-22 NOTE — Interval H&P Note (Signed)
History and Physical Interval Note:  06/22/2012 9:34 AM  Mathew Randolph  has presented today for surgery, with the diagnosis of organic erectile dysfunction  The various methods of treatment have been discussed with the patient and family. After consideration of risks, benefits and other options for treatment, the patient has consented to  Procedure(s) (LRB): PENILE PROSTHESIS (N/A) as a surgical intervention .  The patient's history has been reviewed, patient examined, no change in status, stable for surgery.  I have reviewed the patient's chart and labs.  Questions were answered to the patient's satisfaction.     Garnett Farm

## 2012-06-22 NOTE — Anesthesia Postprocedure Evaluation (Signed)
  Anesthesia Post-op Note  Patient: Mathew Randolph  Procedure(s) Performed: Procedure(s) (LRB): PENILE PROSTHESIS (N/A)  Patient Location: PACU  Anesthesia Type: General  Level of Consciousness: awake and alert   Airway and Oxygen Therapy: Patient Spontanous Breathing  Post-op Pain: mild  Post-op Assessment: Post-op Vital signs reviewed, Patient's Cardiovascular Status Stable, Respiratory Function Stable, Patent Airway and No signs of Nausea or vomiting  Post-op Vital Signs: stable  Complications: No apparent anesthesia complications
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
## 2012-06-22 NOTE — Transfer of Care (Signed)
Immediate Anesthesia Transfer of Care Note  Patient: Mathew Randolph  Procedure(s) Performed: Procedure(s) (LRB): PENILE PROSTHESIS (N/A)  Patient Location: PACU  Anesthesia Type: General  Level of Consciousness: awake, oriented, sedated and patient cooperative  Airway & Oxygen Therapy: Patient Spontanous Breathing and Patient connected to face mask oxygen  Post-op Assessment: Report given to PACU RN and Post -op Vital signs reviewed and stable  Post vital signs: Reviewed and stable  Complications: No apparent anesthesia complications

## 2012-06-28 ENCOUNTER — Encounter (HOSPITAL_BASED_OUTPATIENT_CLINIC_OR_DEPARTMENT_OTHER): Payer: Self-pay | Admitting: Urology

## 2012-07-04 ENCOUNTER — Other Ambulatory Visit: Payer: Self-pay | Admitting: Internal Medicine

## 2012-07-24 ENCOUNTER — Other Ambulatory Visit: Payer: Self-pay | Admitting: Internal Medicine

## 2012-07-30 ENCOUNTER — Encounter: Payer: BC Managed Care – PPO | Admitting: Family

## 2012-08-23 ENCOUNTER — Other Ambulatory Visit: Payer: Self-pay | Admitting: Internal Medicine

## 2012-08-27 ENCOUNTER — Encounter: Payer: BC Managed Care – PPO | Admitting: Family

## 2012-09-12 ENCOUNTER — Ambulatory Visit (INDEPENDENT_AMBULATORY_CARE_PROVIDER_SITE_OTHER): Payer: BC Managed Care – PPO | Admitting: Family

## 2012-09-12 DIAGNOSIS — I4891 Unspecified atrial fibrillation: Secondary | ICD-10-CM | POA: Diagnosis not present

## 2012-09-12 LAB — POCT INR: INR: 1.3

## 2012-09-12 NOTE — Patient Instructions (Addendum)
Take 2 tabs today and tomorrow (10mg ). Then increase to 7.5mg  everyday. Recheck in 2 weeks.     Latest dosing instructions   Total Sun Mon Tue Wed Thu Fri Sat   52.5 7.5 mg 7.5 mg 7.5 mg 7.5 mg 7.5 mg 7.5 mg 7.5 mg    (5 mg1.5) (5 mg1.5) (5 mg1.5) (5 mg1.5) (5 mg1.5) (5 mg1.5) (5 mg1.5)

## 2012-09-24 ENCOUNTER — Telehealth: Payer: Self-pay | Admitting: Internal Medicine

## 2012-09-24 NOTE — Telephone Encounter (Signed)
[place] and will need a 30 day supply of the follow medications:  Warfarin Metoprolol Losartan Nexium Celebrex Sertraline  [facility] [place] [place]

## 2012-09-25 ENCOUNTER — Encounter: Payer: BC Managed Care – PPO | Admitting: Family

## 2012-09-25 ENCOUNTER — Ambulatory Visit: Payer: BC Managed Care – PPO | Admitting: Family

## 2012-09-25 MED ORDER — LOSARTAN POTASSIUM 100 MG PO TABS: 100.0000 mg | ORAL_TABLET | Freq: Every day | ORAL | Status: DC

## 2012-09-25 MED ORDER — SERTRALINE HCL 50 MG PO TABS: 50.0000 mg | ORAL_TABLET | Freq: Every day | ORAL | Status: DC

## 2012-09-25 MED ORDER — METOPROLOL SUCCINATE ER 50 MG PO TB24: 150.0000 mg | ORAL_TABLET | Freq: Every day | ORAL | Status: DC

## 2012-09-25 MED ORDER — ESOMEPRAZOLE MAGNESIUM 40 MG PO CPDR: 40.0000 mg | DELAYED_RELEASE_CAPSULE | Freq: Every day | ORAL | Status: DC

## 2012-09-25 MED ORDER — WARFARIN SODIUM 5 MG PO TABS: 5.0000 mg | ORAL_TABLET | Freq: Every day | ORAL | Status: DC

## 2012-09-25 MED ORDER — CELECOXIB 200 MG PO CAPS: 200.0000 mg | ORAL_CAPSULE | Freq: Every day | ORAL | Status: DC

## 2012-09-25 NOTE — Telephone Encounter (Signed)
rx sent in electronically 

## 2012-09-26 ENCOUNTER — Encounter: Payer: BC Managed Care – PPO | Admitting: Family

## 2012-10-02 ENCOUNTER — Encounter: Payer: BC Managed Care – PPO | Admitting: Family

## 2012-10-08 ENCOUNTER — Ambulatory Visit (INDEPENDENT_AMBULATORY_CARE_PROVIDER_SITE_OTHER): Payer: BC Managed Care – PPO | Admitting: Family

## 2012-10-08 DIAGNOSIS — I4891 Unspecified atrial fibrillation: Secondary | ICD-10-CM

## 2012-10-08 LAB — POCT INR: INR: 1.6

## 2012-10-08 NOTE — Patient Instructions (Addendum)
  Latest dosing instructions   Total Sun Mon Tue Wed Thu Fri Sat   57.5 7.5 mg 10 mg 7.5 mg 7.5 mg 7.5 mg 10 mg 7.5 mg    (5 mg1.5) (5 mg2) (5 mg1.5) (5 mg1.5) (5 mg1.5) (5 mg2) (5 mg1.5)        

## 2012-10-16 ENCOUNTER — Ambulatory Visit (INDEPENDENT_AMBULATORY_CARE_PROVIDER_SITE_OTHER): Payer: BC Managed Care – PPO | Admitting: Internal Medicine

## 2012-10-16 ENCOUNTER — Ambulatory Visit (INDEPENDENT_AMBULATORY_CARE_PROVIDER_SITE_OTHER): Payer: BC Managed Care – PPO | Admitting: Family

## 2012-10-16 ENCOUNTER — Encounter: Payer: Self-pay | Admitting: Internal Medicine

## 2012-10-16 VITALS — BP 124/82 | HR 80 | Temp 98.1°F | Wt 228.0 lb

## 2012-10-16 DIAGNOSIS — I4891 Unspecified atrial fibrillation: Secondary | ICD-10-CM

## 2012-10-16 MED ORDER — AMPHETAMINE-DEXTROAMPHET ER 25 MG PO CP24: 25.0000 mg | ORAL_CAPSULE | Freq: Every day | ORAL | Status: DC

## 2012-10-16 NOTE — Patient Instructions (Addendum)
Take 10mg  today. Then increase to 10mg  Monday, Wednesday and Friday. All other days 7.5mg   Recheck in 2 weeks.     Latest dosing instructions   Total Glynis Smiles Tue Wed Thu Fri Sat   60 7.5 mg 10 mg 7.5 mg 10 mg 7.5 mg 10 mg 7.5 mg    (5 mg1.5) (5 mg2) (5 mg1.5) (5 mg2) (5 mg1.5) (5 mg2) (5 mg1.5)

## 2012-10-16 NOTE — Assessment & Plan Note (Signed)
His sxs are consistent with PAF i'll try to document with 48 hour holter Continue same meds for now
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
## 2012-11-05 ENCOUNTER — Ambulatory Visit (INDEPENDENT_AMBULATORY_CARE_PROVIDER_SITE_OTHER): Payer: BC Managed Care – PPO | Admitting: Family

## 2012-11-05 ENCOUNTER — Telehealth: Payer: Self-pay | Admitting: Family

## 2012-11-05 ENCOUNTER — Encounter: Payer: BC Managed Care – PPO | Admitting: Family

## 2012-11-05 DIAGNOSIS — I4891 Unspecified atrial fibrillation: Secondary | ICD-10-CM

## 2012-11-05 LAB — POCT INR: INR: 3.8

## 2012-11-05 NOTE — Telephone Encounter (Signed)
Pt was directed to triage nurse earlier and wanted to have his coumadin re-checked. He thinks he may have taken too much this weekend. He was following up because nurse is to call him. Pls call pt at [phone]

## 2012-11-05 NOTE — Telephone Encounter (Signed)
PT SEEN IN OFFICE

## 2012-11-06 ENCOUNTER — Ambulatory Visit (INDEPENDENT_AMBULATORY_CARE_PROVIDER_SITE_OTHER): Payer: BC Managed Care – PPO | Admitting: Family

## 2012-11-06 ENCOUNTER — Telehealth: Payer: Self-pay | Admitting: Internal Medicine

## 2012-11-06 DIAGNOSIS — I4891 Unspecified atrial fibrillation: Secondary | ICD-10-CM

## 2012-11-06 DIAGNOSIS — N529 Male erectile dysfunction, unspecified: Secondary | ICD-10-CM | POA: Diagnosis not present

## 2012-11-06 DIAGNOSIS — D090 Carcinoma in situ of bladder: Secondary | ICD-10-CM | POA: Diagnosis not present

## 2012-11-06 DIAGNOSIS — N486 Induration penis plastica: Secondary | ICD-10-CM | POA: Diagnosis not present

## 2012-11-06 DIAGNOSIS — B356 Tinea cruris: Secondary | ICD-10-CM | POA: Diagnosis not present

## 2012-11-06 NOTE — Patient Instructions (Signed)
Hold Coumadin x 2 days.

## 2012-11-06 NOTE — Patient Instructions (Signed)
Hold coumadin today. Recheck in 2 weeks. Continue same dose.

## 2012-11-06 NOTE — Telephone Encounter (Signed)
New Problem:    Patient called in needing to reschedule his monitor appointment that he wasn't able to make today due to an unexpected medical scheduling issue.  Please call back with a new date and time.

## 2012-11-07 NOTE — Telephone Encounter (Signed)
Sent scheduling staff msg to reschedule pt.

## 2012-11-13 DIAGNOSIS — D090 Carcinoma in situ of bladder: Secondary | ICD-10-CM | POA: Diagnosis not present

## 2012-11-19 ENCOUNTER — Ambulatory Visit (INDEPENDENT_AMBULATORY_CARE_PROVIDER_SITE_OTHER): Payer: Medicare Other | Admitting: Family

## 2012-11-19 DIAGNOSIS — Z7901 Long term (current) use of anticoagulants: Secondary | ICD-10-CM | POA: Diagnosis not present

## 2012-11-19 DIAGNOSIS — I4891 Unspecified atrial fibrillation: Secondary | ICD-10-CM

## 2012-11-19 LAB — POCT INR: INR: 3.6

## 2012-11-19 MED ORDER — AMPHETAMINE-DEXTROAMPHET ER 25 MG PO CP24: 25.0000 mg | ORAL_CAPSULE | Freq: Every day | ORAL | Status: DC

## 2012-11-19 NOTE — Patient Instructions (Addendum)
Hold coumadin Tuesday.  Decrease Coumadin to 10mg  on Monday and Friday.  All other days 7.5mg .  3 weeks    Latest dosing instructions   Total Sun Mon Tue Wed Thu Fri Sat   57.5 7.5 mg 10 mg 7.5 mg 7.5 mg 7.5 mg 10 mg 7.5 mg    (5 mg1.5) (5 mg2) (5 mg1.5) (5 mg1.5) (5 mg1.5) (5 mg2) (5 mg1.5)

## 2012-11-20 DIAGNOSIS — C679 Malignant neoplasm of bladder, unspecified: Secondary | ICD-10-CM | POA: Diagnosis not present

## 2012-12-08 ENCOUNTER — Other Ambulatory Visit: Payer: Self-pay | Admitting: Internal Medicine

## 2012-12-10 ENCOUNTER — Ambulatory Visit (INDEPENDENT_AMBULATORY_CARE_PROVIDER_SITE_OTHER): Payer: Commercial Managed Care - PPO | Admitting: Family

## 2012-12-10 DIAGNOSIS — I4891 Unspecified atrial fibrillation: Secondary | ICD-10-CM | POA: Diagnosis not present

## 2012-12-10 DIAGNOSIS — Z7901 Long term (current) use of anticoagulants: Secondary | ICD-10-CM | POA: Diagnosis not present

## 2012-12-10 NOTE — Patient Instructions (Addendum)
Take an extra 1/2 tab today and tomorrow.  Continue Coumadin to 10mg  on Monday and Friday.  All other days 7.5mg . Recheck in 3 weeks    Latest dosing instructions   Total Sun Mon Tue Wed Thu Fri Sat   57.5 7.5 mg 10 mg 7.5 mg 7.5 mg 7.5 mg 10 mg 7.5 mg    (5 mg1.5) (5 mg2) (5 mg1.5) (5 mg1.5) (5 mg1.5) (5 mg2) (5 mg1.5)

## 2012-12-20 ENCOUNTER — Telehealth: Payer: Self-pay | Admitting: Internal Medicine

## 2012-12-20 MED ORDER — WARFARIN SODIUM 5 MG PO TABS: ORAL_TABLET | ORAL | Status: DC

## 2012-12-20 NOTE — Telephone Encounter (Signed)
[facility] and 90 day supply to [facility]

## 2012-12-20 NOTE — Telephone Encounter (Signed)
Patient called stating that he is at [facility] [address] and his warfarin dosage is incorrect. Patient states that the MD has increased his dosage and has not changed the the refill. Please assist and correct patient refill.

## 2012-12-28 ENCOUNTER — Encounter: Payer: Commercial Managed Care - PPO | Admitting: Family

## 2012-12-31 ENCOUNTER — Ambulatory Visit (INDEPENDENT_AMBULATORY_CARE_PROVIDER_SITE_OTHER): Payer: Commercial Managed Care - PPO | Admitting: Family

## 2012-12-31 ENCOUNTER — Encounter: Payer: Commercial Managed Care - PPO | Admitting: Family

## 2012-12-31 DIAGNOSIS — Z7901 Long term (current) use of anticoagulants: Secondary | ICD-10-CM

## 2012-12-31 DIAGNOSIS — I4891 Unspecified atrial fibrillation: Secondary | ICD-10-CM | POA: Diagnosis not present

## 2012-12-31 NOTE — Patient Instructions (Addendum)
Take an extra 1/2 tab today and tomorrow.  Continue Coumadin to 10mg on Monday and Friday.  All other days 7.5mg. Recheck in 3 weeks    Latest dosing instructions   Total Sun Mon Tue Wed Thu Fri Sat   57.5 7.5 mg 10 mg 7.5 mg 7.5 mg 7.5 mg 10 mg 7.5 mg    (5 mg1.5) (5 mg2) (5 mg1.5) (5 mg1.5) (5 mg1.5) (5 mg2) (5 mg1.5)        

## 2013-01-21 ENCOUNTER — Telehealth: Payer: Self-pay | Admitting: Internal Medicine

## 2013-01-21 ENCOUNTER — Encounter: Payer: Commercial Managed Care - PPO | Admitting: Family

## 2013-01-21 MED ORDER — AMPHETAMINE-DEXTROAMPHET ER 25 MG PO CP24: 25.0000 mg | ORAL_CAPSULE | Freq: Every day | ORAL | Status: DC

## 2013-01-21 NOTE — Telephone Encounter (Signed)
Ok per Dr Cato Mulligan.  rx will be ready for p/u tomorrow pt aware

## 2013-01-21 NOTE — Telephone Encounter (Signed)
Patient called stating that he need a refill of his adderall 25mg 1poqd. Please assist.  °

## 2013-01-22 ENCOUNTER — Ambulatory Visit (INDEPENDENT_AMBULATORY_CARE_PROVIDER_SITE_OTHER): Payer: Medicare Other | Admitting: Family

## 2013-01-22 DIAGNOSIS — I4891 Unspecified atrial fibrillation: Secondary | ICD-10-CM

## 2013-01-22 DIAGNOSIS — Z7901 Long term (current) use of anticoagulants: Secondary | ICD-10-CM | POA: Diagnosis not present

## 2013-01-22 NOTE — Patient Instructions (Addendum)
Continue Coumadin to 10mg  on Monday and Friday.  All other days 7.5mg . Recheck in 4 weeks  Anticoagulation Dose Instructions as of 01/22/2013     Glynis Smiles Tue Wed Thu Fri Sat   New Dose 7.5 mg 10 mg 7.5 mg 7.5 mg 7.5 mg 10 mg 7.5 mg    Description       Continue Coumadin to 10mg  on Monday and Friday.  All other days 7.5mg . Recheck in 4 weeks

## 2013-01-31 ENCOUNTER — Other Ambulatory Visit: Payer: Self-pay | Admitting: *Deleted

## 2013-01-31 MED ORDER — METOPROLOL SUCCINATE ER 50 MG PO TB24: 150.0000 mg | ORAL_TABLET | Freq: Every day | ORAL | Status: DC

## 2013-01-31 MED ORDER — LOSARTAN POTASSIUM 100 MG PO TABS: 100.0000 mg | ORAL_TABLET | Freq: Every day | ORAL | Status: DC

## 2013-01-31 MED ORDER — SERTRALINE HCL 50 MG PO TABS: 50.0000 mg | ORAL_TABLET | Freq: Every day | ORAL | Status: DC

## 2013-01-31 MED ORDER — ESOMEPRAZOLE MAGNESIUM 40 MG PO CPDR: 40.0000 mg | DELAYED_RELEASE_CAPSULE | Freq: Every day | ORAL | Status: DC

## 2013-01-31 MED ORDER — WARFARIN SODIUM 5 MG PO TABS: ORAL_TABLET | ORAL | Status: DC

## 2013-02-01 ENCOUNTER — Other Ambulatory Visit: Payer: Self-pay | Admitting: *Deleted

## 2013-02-01 MED ORDER — BECLOMETHASONE DIPROPIONATE 40 MCG/ACT IN AERS: 2.0000 | INHALATION_SPRAY | Freq: Two times a day (BID) | RESPIRATORY_TRACT | Status: DC

## 2013-02-01 MED ORDER — LOSARTAN POTASSIUM 100 MG PO TABS: 100.0000 mg | ORAL_TABLET | Freq: Every day | ORAL | Status: DC

## 2013-02-03 ENCOUNTER — Other Ambulatory Visit: Payer: Self-pay | Admitting: Internal Medicine

## 2013-02-05 ENCOUNTER — Other Ambulatory Visit: Payer: Self-pay

## 2013-02-05 MED ORDER — WARFARIN SODIUM 5 MG PO TABS: ORAL_TABLET | ORAL | Status: DC

## 2013-02-19 ENCOUNTER — Encounter: Payer: Medicare Other | Admitting: Family

## 2013-03-08 ENCOUNTER — Other Ambulatory Visit: Payer: Self-pay | Admitting: Internal Medicine

## 2013-03-12 ENCOUNTER — Telehealth: Payer: Self-pay | Admitting: Family

## 2013-03-12 NOTE — Telephone Encounter (Signed)
Pt is still in [place] and is expecting to be back next week. He does, however, state that he will try to locate a [facility] there so that he can have his inr checked this week. Pt will call back with a fax number if he locates an office, otherwise, he will schedule to come in the office next week.

## 2013-03-12 NOTE — Telephone Encounter (Signed)
Needs an INR check asap!! 

## 2013-03-14 DIAGNOSIS — I10 Essential (primary) hypertension: Secondary | ICD-10-CM | POA: Diagnosis not present

## 2013-03-14 DIAGNOSIS — M1990 Unspecified osteoarthritis, unspecified site: Secondary | ICD-10-CM | POA: Diagnosis not present

## 2013-03-14 DIAGNOSIS — K219 Gastro-esophageal reflux disease without esophagitis: Secondary | ICD-10-CM | POA: Diagnosis not present

## 2013-03-14 DIAGNOSIS — D6859 Other primary thrombophilia: Secondary | ICD-10-CM | POA: Diagnosis not present

## 2013-03-18 ENCOUNTER — Telehealth: Payer: Self-pay | Admitting: Internal Medicine

## 2013-03-18 MED ORDER — AMPHETAMINE-DEXTROAMPHET ER 25 MG PO CP24: 25.0000 mg | ORAL_CAPSULE | Freq: Every day | ORAL | Status: DC

## 2013-03-18 NOTE — Telephone Encounter (Signed)
Patient called stating that he need a refill of his adderall 25 mg 1poqd as he is completely out. Please assist.

## 2013-03-18 NOTE — Telephone Encounter (Signed)
rx up front ready for p/u, pt aware 

## 2013-03-21 LAB — POCT INR: INR: 2.1

## 2013-03-25 ENCOUNTER — Encounter: Payer: Self-pay | Admitting: *Deleted

## 2013-03-25 ENCOUNTER — Other Ambulatory Visit: Payer: Self-pay | Admitting: Family

## 2013-03-25 ENCOUNTER — Telehealth: Payer: Self-pay | Admitting: Internal Medicine

## 2013-03-25 ENCOUNTER — Ambulatory Visit (INDEPENDENT_AMBULATORY_CARE_PROVIDER_SITE_OTHER): Payer: Medicare Other | Admitting: Family

## 2013-03-25 ENCOUNTER — Encounter: Payer: Self-pay | Admitting: Family

## 2013-03-25 MED ORDER — AMPHETAMINE-DEXTROAMPHET ER 25 MG PO CP24: 25.0000 mg | ORAL_CAPSULE | Freq: Every day | ORAL | Status: DC

## 2013-03-25 NOTE — Telephone Encounter (Signed)
Pt pick up one rx adderall on 03-25-13. Pt would like to pick up 2 additional rx for adderall for months of May and June. Pt will be going back to [facility] in June

## 2013-03-25 NOTE — Patient Instructions (Signed)
Continue Coumadin to 10mg  on Monday and Friday.  All other days 7.5mg . Recheck in 4 weeks  Anticoagulation Dose Instructions as of 03/25/2013     Glynis Smiles Tue Wed Thu Fri Sat   New Dose 7.5 mg 10 mg 7.5 mg 7.5 mg 7.5 mg 10 mg 7.5 mg    Description       Continue Coumadin to 10mg  on Monday and Friday.  All other days 7.5mg . Recheck in 4 weeks

## 2013-03-25 NOTE — Telephone Encounter (Signed)
rx up front for p/u, pt aware 

## 2013-03-26 DIAGNOSIS — C61 Malignant neoplasm of prostate: Secondary | ICD-10-CM | POA: Diagnosis not present

## 2013-03-26 DIAGNOSIS — N323 Diverticulum of bladder: Secondary | ICD-10-CM | POA: Diagnosis not present

## 2013-03-26 DIAGNOSIS — D090 Carcinoma in situ of bladder: Secondary | ICD-10-CM | POA: Diagnosis not present

## 2013-03-26 DIAGNOSIS — N401 Enlarged prostate with lower urinary tract symptoms: Secondary | ICD-10-CM | POA: Diagnosis not present

## 2013-03-26 DIAGNOSIS — N138 Other obstructive and reflux uropathy: Secondary | ICD-10-CM | POA: Diagnosis not present

## 2013-03-26 DIAGNOSIS — C679 Malignant neoplasm of bladder, unspecified: Secondary | ICD-10-CM | POA: Diagnosis not present

## 2013-03-28 ENCOUNTER — Other Ambulatory Visit: Payer: Self-pay | Admitting: Internal Medicine

## 2013-05-01 ENCOUNTER — Telehealth: Payer: Self-pay | Admitting: Family

## 2013-05-01 ENCOUNTER — Ambulatory Visit: Payer: Medicare Other | Admitting: Family

## 2013-05-01 LAB — POCT INR: INR: 2.3

## 2013-05-01 NOTE — Telephone Encounter (Signed)
Needs INR checked

## 2013-05-01 NOTE — Patient Instructions (Addendum)
Continue Coumadin to 10mg  on Monday, Wednesday and Friday.  All other days 7.5mg . Recheck in 6 weeks  Anticoagulation Dose Instructions as of 05/01/2013     Mathew Randolph Tue Wed Thu Fri Sat   New Dose 7.5 mg 10 mg 7.5 mg 10 mg 7.5 mg 10 mg 7.5 mg    Description       Continue Coumadin to 10mg  on Monday, Wednesday and Friday.  All other days 7.5mg . Recheck in 6 weeks

## 2013-05-01 NOTE — Telephone Encounter (Signed)
Appointment scheduled.

## 2013-05-03 ENCOUNTER — Telehealth: Payer: Self-pay

## 2013-05-03 NOTE — Telephone Encounter (Signed)
Message copied by Beverely Low on Fri May 03, 2013 10:27 AM ------      Message from: Adline Mango B      Created: Fri May 03, 2013  8:19 AM       Please advise patient that if he wants to try Xarelto, Dr. Cato Mulligan says it is ok. Understand that the medication is indicated for clot prevention. However he has also has a hypercoagulability state that has caused clotting. It is likely to be as effective as Coumadin but not studied in this area. Let me know if he would like to proceed.            Padonda      ----- Message -----         From: Lindley Magnus, MD         Sent: 05/03/2013   6:41 AM           To: Baker Pierini, FNP            He would need to understand that any medication we used would be "off label" but i would be ok with xarelto      ----- Message -----         From: Baker Pierini, FNP         Sent: 05/01/2013   4:43 PM           To: Lindley Magnus, MD            Hi!!!            Patient travels a lot and has difficulty making his appointments for PT/INR checks. Would like to consider a new agent per your recommendation.            Padonda             ------

## 2013-05-03 NOTE — Telephone Encounter (Signed)
Left detailed message on personally identified voicemail to notify pt of PC note. Advised pt to call back

## 2013-05-06 NOTE — Telephone Encounter (Signed)
LMTCB

## 2013-05-13 ENCOUNTER — Encounter: Payer: Self-pay | Admitting: Gastroenterology

## 2013-05-24 ENCOUNTER — Other Ambulatory Visit: Payer: Self-pay | Admitting: Dermatology

## 2013-05-24 DIAGNOSIS — D235 Other benign neoplasm of skin of trunk: Secondary | ICD-10-CM | POA: Diagnosis not present

## 2013-05-24 DIAGNOSIS — M7700 Medial epicondylitis, unspecified elbow: Secondary | ICD-10-CM | POA: Diagnosis not present

## 2013-05-24 DIAGNOSIS — D1801 Hemangioma of skin and subcutaneous tissue: Secondary | ICD-10-CM | POA: Diagnosis not present

## 2013-05-24 DIAGNOSIS — M1710 Unilateral primary osteoarthritis, unspecified knee: Secondary | ICD-10-CM | POA: Diagnosis not present

## 2013-05-24 DIAGNOSIS — L821 Other seborrheic keratosis: Secondary | ICD-10-CM | POA: Diagnosis not present

## 2013-05-24 DIAGNOSIS — D485 Neoplasm of uncertain behavior of skin: Secondary | ICD-10-CM | POA: Diagnosis not present

## 2013-05-24 DIAGNOSIS — C44211 Basal cell carcinoma of skin of unspecified ear and external auricular canal: Secondary | ICD-10-CM | POA: Diagnosis not present

## 2013-05-24 DIAGNOSIS — G5600 Carpal tunnel syndrome, unspecified upper limb: Secondary | ICD-10-CM | POA: Diagnosis not present

## 2013-05-24 DIAGNOSIS — L570 Actinic keratosis: Secondary | ICD-10-CM | POA: Diagnosis not present

## 2013-06-03 DIAGNOSIS — G5600 Carpal tunnel syndrome, unspecified upper limb: Secondary | ICD-10-CM | POA: Diagnosis not present

## 2013-06-03 DIAGNOSIS — R209 Unspecified disturbances of skin sensation: Secondary | ICD-10-CM | POA: Diagnosis not present

## 2013-06-13 DIAGNOSIS — G5600 Carpal tunnel syndrome, unspecified upper limb: Secondary | ICD-10-CM | POA: Diagnosis not present

## 2013-06-17 ENCOUNTER — Telehealth: Payer: Self-pay | Admitting: Internal Medicine

## 2013-06-17 ENCOUNTER — Other Ambulatory Visit: Payer: Self-pay

## 2013-06-17 DIAGNOSIS — Z79899 Other long term (current) drug therapy: Secondary | ICD-10-CM

## 2013-06-17 NOTE — Telephone Encounter (Signed)
Pt called requesting new rx's on Adderall Also states that [facility] faxed [place] refill requests on meds and he wants that taken care of as soon as possible.

## 2013-06-18 ENCOUNTER — Other Ambulatory Visit (INDEPENDENT_AMBULATORY_CARE_PROVIDER_SITE_OTHER): Payer: Medicare Other

## 2013-06-18 DIAGNOSIS — I10 Essential (primary) hypertension: Secondary | ICD-10-CM | POA: Diagnosis not present

## 2013-06-18 LAB — COMPREHENSIVE METABOLIC PANEL
ALT: 19 U/L (ref 0–53)
AST: 20 U/L (ref 0–37)
Albumin: 4.1 g/dL (ref 3.5–5.2)
CO2: 29 mEq/L (ref 19–32)
Calcium: 9.8 mg/dL (ref 8.4–10.5)
Chloride: 108 mEq/L (ref 96–112)
GFR: 57.39 mL/min — ABNORMAL LOW (ref >60.00)
Potassium: 4.1 mEq/L (ref 3.5–5.1)
Total Protein: 7.3 g/dL (ref 6.0–8.3)

## 2013-06-18 MED ORDER — AMPHETAMINE-DEXTROAMPHET ER 25 MG PO CP24: 25.0000 mg | ORAL_CAPSULE | Freq: Every day | ORAL | Status: DC

## 2013-06-18 NOTE — Telephone Encounter (Signed)
rx up front for p/u, pt will need OV, pt aware

## 2013-06-20 DIAGNOSIS — C61 Malignant neoplasm of prostate: Secondary | ICD-10-CM | POA: Diagnosis not present

## 2013-06-20 DIAGNOSIS — Z79899 Other long term (current) drug therapy: Secondary | ICD-10-CM | POA: Diagnosis not present

## 2013-06-20 DIAGNOSIS — D090 Carcinoma in situ of bladder: Secondary | ICD-10-CM | POA: Diagnosis not present

## 2013-08-12 DIAGNOSIS — H269 Unspecified cataract: Secondary | ICD-10-CM | POA: Diagnosis not present

## 2013-08-16 ENCOUNTER — Telehealth: Payer: Self-pay | Admitting: General Practice

## 2013-08-16 NOTE — Telephone Encounter (Signed)
Message copied by Garrison Columbus on Fri Aug 16, 2013  8:03 AM ------      Message from: Lindley Magnus      Created: Thu Aug 15, 2013  8:44 PM       That would be fine      He doesn't have classic thromboembolism... Obviously xarelto would be off label      ----- Message -----         From: Garrison Columbus, RN         Sent: 08/15/2013   3:21 PM           To: Lindley Magnus, MD            Hi Dr. Cato Mulligan,            Patient is asking about switching from coumadin to Xarelto.  He said that he spoke with you about it at some point.  What are you thoughts about it.            Thanks,      Bailey Mech, RN       ------

## 2013-08-19 ENCOUNTER — Other Ambulatory Visit: Payer: Self-pay | Admitting: General Practice

## 2013-08-19 ENCOUNTER — Encounter: Payer: Self-pay | Admitting: Internal Medicine

## 2013-08-19 ENCOUNTER — Ambulatory Visit (INDEPENDENT_AMBULATORY_CARE_PROVIDER_SITE_OTHER): Payer: Medicare Other | Admitting: General Practice

## 2013-08-19 ENCOUNTER — Ambulatory Visit (INDEPENDENT_AMBULATORY_CARE_PROVIDER_SITE_OTHER): Payer: Medicare Other | Admitting: Internal Medicine

## 2013-08-19 VITALS — BP 138/80 | HR 55 | Temp 98.1°F | Resp 20 | Wt 234.0 lb

## 2013-08-19 DIAGNOSIS — Z23 Encounter for immunization: Secondary | ICD-10-CM

## 2013-08-19 DIAGNOSIS — D6859 Other primary thrombophilia: Secondary | ICD-10-CM | POA: Diagnosis not present

## 2013-08-19 DIAGNOSIS — E785 Hyperlipidemia, unspecified: Secondary | ICD-10-CM | POA: Diagnosis not present

## 2013-08-19 DIAGNOSIS — I4891 Unspecified atrial fibrillation: Secondary | ICD-10-CM | POA: Diagnosis not present

## 2013-08-19 DIAGNOSIS — I10 Essential (primary) hypertension: Secondary | ICD-10-CM | POA: Diagnosis not present

## 2013-08-19 MED ORDER — WARFARIN SODIUM 5 MG PO TABS: ORAL_TABLET | ORAL | Status: DC

## 2013-08-19 MED ORDER — AMPHETAMINE-DEXTROAMPHET ER 25 MG PO CP24: 25.0000 mg | ORAL_CAPSULE | Freq: Every day | ORAL | Status: DC

## 2013-08-19 MED ORDER — AMPHETAMINE-DEXTROAMPHET ER 25 MG PO CP24: 25.0000 mg | ORAL_CAPSULE | ORAL | Status: DC

## 2013-08-19 NOTE — Patient Instructions (Addendum)
It is important that you exercise regularly, at least 20 minutes 3 to 4 times per week.  If you develop chest pain or shortness of breath seek  medical attention.  You need to lose weight.  Consider a lower calorie diet and regular exercise.  Return in 6 months for follow-up Fat and Cholesterol Control Diet Cholesterol levels in your body are determined significantly by your diet. Cholesterol levels may also be related to heart disease. The following material helps to explain this relationship and discusses what you can do to help keep your heart healthy. Not all cholesterol is bad. Low-density lipoprotein (LDL) cholesterol is the "bad" cholesterol. It may cause fatty deposits to build up inside your arteries. High-density lipoprotein (HDL) cholesterol is "good." It helps to remove the "bad" LDL cholesterol from your blood. Cholesterol is a very important risk factor for heart disease. Other risk factors are high blood pressure, smoking, stress, heredity, and weight. The heart muscle gets its supply of blood through the coronary arteries. If your LDL cholesterol is high and your HDL cholesterol is low, you are at risk for having fatty deposits build up in your coronary arteries. This leaves less room through which blood can flow. Without sufficient blood and oxygen, the heart muscle cannot function properly and you may feel chest pains (angina pectoris). When a coronary artery closes up entirely, a part of the heart muscle may die causing a heart attack (myocardial infarction). CHECKING CHOLESTEROL When your caregiver sends your blood to a lab to be examined for cholesterol, a complete lipid (fat) profile may be done. With this test, the total amount of cholesterol and levels of LDL and HDL are determined. Triglycerides are a type of fat that circulates in the blood. They can also be used to determine heart disease risk. The list below describes what the numbers should be: Test: Total  Cholesterol.  Less than 200 mg/dl. Test: LDL "bad cholesterol."  Less than 100 mg/dl.  Less than 70 mg/dl if you are at very high risk of a heart attack or sudden cardiac death. Test: HDL "good cholesterol."  Greater than 50 mg/dl for women.  Greater than 40 mg/dl for men. Test: Triglycerides.  Less than 150 mg/dl. CONTROLLING CHOLESTEROL WITH DIET Although exercise and lifestyle factors are important, your diet is key. That is because certain foods are known to raise cholesterol and others to lower it. The goal is to balance foods for their effect on cholesterol and more importantly, to replace saturated and trans fat with other types of fat, such as monounsaturated fat, polyunsaturated fat, and omega-3 fatty acids. On average, a person should consume no more than 15 to 17 g of saturated fat daily. Saturated and trans fats are considered "bad" fats, and they will raise LDL cholesterol. Saturated fats are primarily found in animal products such as meats, butter, and cream. However, that does not mean you need to give up all your favorite foods. Today, there are good tasting, low-fat, low-cholesterol substitutes for most of the things you like to eat. Choose low-fat or nonfat alternatives. Choose round or loin cuts of red meat. These types of cuts are lowest in fat and cholesterol. Chicken (without the skin), fish, veal, and ground [place] breast are great choices. Eliminate fatty meats, such as hot dogs and salami. Even shellfish have little or no saturated fat. Have a 3 oz (85 g) portion when you eat lean meat, poultry, or fish. Trans fats are also called "partially hydrogenated oils." They are  oils that have been scientifically manipulated so that they are solid at room temperature resulting in a longer shelf life and improved taste and texture of foods in which they are added. Trans fats are found in stick margarine, some tub margarines, cookies, crackers, and baked goods.  When baking and  cooking, oils are a great substitute for butter. The monounsaturated oils are especially beneficial since it is believed they lower LDL and raise HDL. The oils you should avoid entirely are saturated tropical oils, such as coconut and palm.  Remember to eat a lot from food groups that are naturally free of saturated and trans fat, including fish, fruit, vegetables, beans, grains (barley, rice, couscous, bulgur wheat), and pasta (without cream sauces).  IDENTIFYING FOODS THAT LOWER CHOLESTEROL  Soluble fiber may lower your cholesterol. This type of fiber is found in fruits such as apples, vegetables such as broccoli, potatoes, and carrots, legumes such as beans, peas, and lentils, and grains such as barley. Foods fortified with plant sterols (phytosterol) may also lower cholesterol. You should eat at least 2 g per day of these foods for a cholesterol lowering effect.  Read package labels to identify low-saturated fats, trans fat free, and low-fat foods at the supermarket. Select cheeses that have only 2 to 3 g saturated fat per ounce. Use a heart-healthy tub margarine that is free of trans fats or partially hydrogenated oil. When buying baked goods (cookies, crackers), avoid partially hydrogenated oils. Breads and muffins should be made from whole grains (whole-wheat or whole oat flour, instead of "flour" or "enriched flour"). Buy non-creamy canned soups with reduced salt and no added fats.  FOOD PREPARATION TECHNIQUES  Never deep-fry. If you must fry, either stir-fry, which uses very little fat, or use non-stick cooking sprays. When possible, broil, bake, or roast meats, and steam vegetables. Instead of putting butter or margarine on vegetables, use lemon and herbs, applesauce, and cinnamon (for squash and sweet potatoes), nonfat yogurt, salsa, and low-fat dressings for salads.  LOW-SATURATED FAT / LOW-FAT FOOD SUBSTITUTES Meats / Saturated Fat (g)  Avoid: Steak, marbled (3 oz/85 g) / 11 g  Choose:  Steak, lean (3 oz/85 g) / 4 g  Avoid: Hamburger (3 oz/85 g) / 7 g  Choose: Hamburger, lean (3 oz/85 g) / 5 g  Avoid: Ham (3 oz/85 g) / 6 g  Choose: Ham, lean cut (3 oz/85 g) / 2.4 g  Avoid: Chicken, with skin, dark meat (3 oz/85 g) / 4 g  Choose: Chicken, skin removed, dark meat (3 oz/85 g) / 2 g  Avoid: Chicken, with skin, light meat (3 oz/85 g) / 2.5 g  Choose: Chicken, skin removed, light meat (3 oz/85 g) / 1 g Dairy / Saturated Fat (g)  Avoid: Whole milk (1 cup) / 5 g  Choose: Low-fat milk, 2% (1 cup) / 3 g  Choose: Low-fat milk, 1% (1 cup) / 1.5 g  Choose: Skim milk (1 cup) / 0.3 g  Avoid: Hard cheese (1 oz/28 g) / 6 g  Choose: Skim milk cheese (1 oz/28 g) / 2 to 3 g  Avoid: Cottage cheese, 4% fat (1 cup) / 6.5 g  Choose: Low-fat cottage cheese, 1% fat (1 cup) / 1.5 g  Avoid: Ice cream (1 cup) / 9 g  Choose: Sherbet (1 cup) / 2.5 g  Choose: Nonfat frozen yogurt (1 cup) / 0.3 g  Choose: Frozen fruit bar / trace  Avoid: Whipped cream (1 tbs) / 3.5 g  Choose:  Nondairy whipped topping (1 tbs) / 1 g Condiments / Saturated Fat (g)  Avoid: Mayonnaise (1 tbs) / 2 g  Choose: Low-fat mayonnaise (1 tbs) / 1 g  Avoid: Butter (1 tbs) / 7 g  Choose: Extra light margarine (1 tbs) / 1 g  Avoid: Coconut oil (1 tbs) / 11.8 g  Choose: Olive oil (1 tbs) / 1.8 g  Choose: Corn oil (1 tbs) / 1.7 g  Choose: Safflower oil (1 tbs) / 1.2 g  Choose: Sunflower oil (1 tbs) / 1.4 g  Choose: Soybean oil (1 tbs) / 2.4 g  Choose: Canola oil (1 tbs) / 1 g Document Released: 11/21/2005 Document Revised: 02/13/2012 Document Reviewed: 05/12/2011 [employer] Patient Information 2014 [place], [place]. Hypercholesterolemia High Blood Cholesterol Cholesterol is a white, waxy, fat-like protein needed by your body in small amounts. The liver makes all the cholesterol you need. It is carried from the liver by the blood through the blood vessels. Deposits (plaque) may build up on blood  vessel walls. This makes the arteries narrower and stiffer. Plaque increases the risk for heart attack and stroke. You cannot feel your cholesterol level even if it is very high. The only way to know is by a blood test to check your lipid (fats) levels. Once you know your cholesterol levels, you should keep a record of the test results. Work with your caregiver to to keep your levels in the desired range. WHAT THE RESULTS MEAN:  Total cholesterol is a rough measure of all the cholesterol in your blood.  LDL is the so-called bad cholesterol. This is the type that deposits cholesterol in the walls of the arteries. You want this level to be low.  HDL is the good cholesterol because it cleans the arteries and carries the LDL away. You want this level to be high.  Triglycerides are fat that the body can either burn for energy or store. High levels are closely linked to heart disease. DESIRED LEVELS:  Total cholesterol below 200.  LDL below 100 for people at risk, below 70 for very high risk.  HDL above 50 is good, above 60 is best.  Triglycerides below 150. HOW TO LOWER YOUR CHOLESTEROL:  Diet.  Choose fish or white meat chicken and [place], roasted or baked. Limit fatty cuts of red meat, fried foods, and processed meats, such as sausage and lunch meat.  Eat lots of fresh fruits and vegetables. Choose whole grains, beans, pasta, potatoes and cereals.  Use only small amounts of olive, corn or canola oils. Avoid butter, mayonnaise, shortening or palm kernel oils. Avoid foods with trans-fats.  Use skim/nonfat milk and low-fat/nonfat yogurt and cheeses. Avoid whole milk, cream, ice cream, egg yolks and cheeses. Healthy desserts include angel food cake, gingersnaps, animal crackers, hard candy, popsicles, and low-fat/nonfat frozen yogurt. Avoid pastries, cakes, pies and cookies.  Exercise.  A regular program helps decrease LDL and raises HDL.  Helps with weight control.  Do things that  increase your activity level like gardening, walking, or taking the stairs.  Medication.  May be prescribed by your caregiver to help lowering cholesterol and the risk for heart disease.  You may need medicine even if your levels are normal if you have several risk factors. HOME CARE INSTRUCTIONS   Follow your diet and exercise programs as suggested by your caregiver.  Take medications as directed.  Have blood work done when your caregiver feels it is necessary. MAKE SURE YOU:   Understand these instructions.  Will  watch your condition.  Will get help right away if you are not doing well or get worse. Document Released: 11/21/2005 Document Revised: 02/13/2012 Document Reviewed: 05/09/2007 [facility] Patient Information 2014 [place], [place].
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
## 2013-08-28 ENCOUNTER — Telehealth: Payer: Self-pay | Admitting: Internal Medicine

## 2013-08-28 NOTE — Telephone Encounter (Signed)
Pt was given samples of welchol 6.25 mg tab and would like a rx Pharm: [facility]

## 2013-08-28 NOTE — Telephone Encounter (Signed)
Dr. Kirtland Bouchard please advise Rx Welchol, dosage and how to take?

## 2013-08-29 MED ORDER — COLESEVELAM HCL 625 MG PO TABS: 1875.0000 mg | ORAL_TABLET | Freq: Two times a day (BID) | ORAL | Status: DC

## 2013-08-29 NOTE — Telephone Encounter (Signed)
Spoke to pt told him Rx sent to pharmacy. Pt verbalized understanding. 

## 2013-08-29 NOTE — Telephone Encounter (Signed)
WelChol  6 tablets daily (either once daily or in divided dosages if preferred) Please call in a new prescription

## 2013-08-30 DIAGNOSIS — L819 Disorder of pigmentation, unspecified: Secondary | ICD-10-CM | POA: Diagnosis not present

## 2013-08-30 DIAGNOSIS — Z85828 Personal history of other malignant neoplasm of skin: Secondary | ICD-10-CM | POA: Diagnosis not present

## 2013-08-30 DIAGNOSIS — L259 Unspecified contact dermatitis, unspecified cause: Secondary | ICD-10-CM | POA: Diagnosis not present

## 2013-08-30 DIAGNOSIS — L570 Actinic keratosis: Secondary | ICD-10-CM | POA: Diagnosis not present

## 2013-08-30 DIAGNOSIS — I789 Disease of capillaries, unspecified: Secondary | ICD-10-CM | POA: Diagnosis not present

## 2013-08-30 DIAGNOSIS — L821 Other seborrheic keratosis: Secondary | ICD-10-CM | POA: Diagnosis not present

## 2013-09-02 ENCOUNTER — Ambulatory Visit (INDEPENDENT_AMBULATORY_CARE_PROVIDER_SITE_OTHER): Payer: Medicare Other | Admitting: General Practice

## 2013-09-02 DIAGNOSIS — Z7901 Long term (current) use of anticoagulants: Secondary | ICD-10-CM

## 2013-09-02 DIAGNOSIS — D6859 Other primary thrombophilia: Secondary | ICD-10-CM | POA: Diagnosis not present

## 2013-09-30 ENCOUNTER — Other Ambulatory Visit: Payer: Self-pay

## 2013-09-30 MED ORDER — WARFARIN SODIUM 5 MG PO TABS: ORAL_TABLET | ORAL | Status: DC

## 2013-10-03 ENCOUNTER — Other Ambulatory Visit: Payer: Self-pay | Admitting: General Practice

## 2013-10-03 MED ORDER — WARFARIN SODIUM 5 MG PO TABS: ORAL_TABLET | ORAL | Status: DC

## 2013-10-14 DIAGNOSIS — N390 Urinary tract infection, site not specified: Secondary | ICD-10-CM | POA: Diagnosis not present

## 2013-10-14 DIAGNOSIS — R51 Headache: Secondary | ICD-10-CM | POA: Diagnosis not present

## 2013-10-14 DIAGNOSIS — D6859 Other primary thrombophilia: Secondary | ICD-10-CM | POA: Diagnosis not present

## 2013-10-14 DIAGNOSIS — J3489 Other specified disorders of nose and nasal sinuses: Secondary | ICD-10-CM | POA: Diagnosis not present

## 2013-10-14 DIAGNOSIS — J0100 Acute maxillary sinusitis, unspecified: Secondary | ICD-10-CM | POA: Diagnosis not present

## 2013-10-15 DIAGNOSIS — R51 Headache: Secondary | ICD-10-CM | POA: Diagnosis not present

## 2013-10-15 DIAGNOSIS — Z7901 Long term (current) use of anticoagulants: Secondary | ICD-10-CM | POA: Diagnosis not present

## 2013-10-15 DIAGNOSIS — G9389 Other specified disorders of brain: Secondary | ICD-10-CM | POA: Diagnosis not present

## 2013-10-15 DIAGNOSIS — M531 Cervicobrachial syndrome: Secondary | ICD-10-CM | POA: Diagnosis not present

## 2013-10-18 DIAGNOSIS — IMO0002 Reserved for concepts with insufficient information to code with codable children: Secondary | ICD-10-CM | POA: Diagnosis not present

## 2013-10-18 DIAGNOSIS — M542 Cervicalgia: Secondary | ICD-10-CM | POA: Diagnosis not present

## 2013-10-18 DIAGNOSIS — Z79899 Other long term (current) drug therapy: Secondary | ICD-10-CM | POA: Diagnosis not present

## 2013-10-18 DIAGNOSIS — R51 Headache: Secondary | ICD-10-CM | POA: Diagnosis not present

## 2013-10-22 DIAGNOSIS — M4712 Other spondylosis with myelopathy, cervical region: Secondary | ICD-10-CM | POA: Diagnosis not present

## 2013-10-22 DIAGNOSIS — M47812 Spondylosis without myelopathy or radiculopathy, cervical region: Secondary | ICD-10-CM | POA: Diagnosis not present

## 2013-10-22 DIAGNOSIS — M5020 Other cervical disc displacement, unspecified cervical region: Secondary | ICD-10-CM | POA: Diagnosis not present

## 2013-10-24 DIAGNOSIS — M531 Cervicobrachial syndrome: Secondary | ICD-10-CM | POA: Diagnosis not present

## 2013-10-30 DIAGNOSIS — M2559 Pain in other specified joint: Secondary | ICD-10-CM | POA: Diagnosis not present

## 2013-10-30 DIAGNOSIS — M542 Cervicalgia: Secondary | ICD-10-CM | POA: Diagnosis not present

## 2013-10-30 DIAGNOSIS — M546 Pain in thoracic spine: Secondary | ICD-10-CM | POA: Diagnosis not present

## 2013-10-30 DIAGNOSIS — Z79899 Other long term (current) drug therapy: Secondary | ICD-10-CM | POA: Diagnosis not present

## 2013-11-05 DIAGNOSIS — D6859 Other primary thrombophilia: Secondary | ICD-10-CM | POA: Diagnosis not present

## 2013-11-05 DIAGNOSIS — I881 Chronic lymphadenitis, except mesenteric: Secondary | ICD-10-CM | POA: Diagnosis not present

## 2013-11-05 DIAGNOSIS — R51 Headache: Secondary | ICD-10-CM | POA: Diagnosis not present

## 2013-11-05 DIAGNOSIS — F909 Attention-deficit hyperactivity disorder, unspecified type: Secondary | ICD-10-CM | POA: Diagnosis not present

## 2013-11-15 DIAGNOSIS — C61 Malignant neoplasm of prostate: Secondary | ICD-10-CM | POA: Diagnosis not present

## 2013-11-15 DIAGNOSIS — C679 Malignant neoplasm of bladder, unspecified: Secondary | ICD-10-CM | POA: Diagnosis not present

## 2013-11-15 DIAGNOSIS — D090 Carcinoma in situ of bladder: Secondary | ICD-10-CM | POA: Diagnosis not present

## 2013-11-20 DIAGNOSIS — C679 Malignant neoplasm of bladder, unspecified: Secondary | ICD-10-CM | POA: Diagnosis not present

## 2013-11-25 DIAGNOSIS — C679 Malignant neoplasm of bladder, unspecified: Secondary | ICD-10-CM | POA: Diagnosis not present

## 2013-11-26 ENCOUNTER — Ambulatory Visit: Payer: Medicare Other | Admitting: Internal Medicine

## 2013-12-17 ENCOUNTER — Telehealth: Payer: Self-pay | Admitting: General Practice

## 2013-12-17 NOTE — Telephone Encounter (Signed)
Spoke with patient about making appointment to have INR checked.  He states that he had it checked in December while in [place] and that it was 2.4.  Patient has appointment with Dr. Leanne Chang in March and wants to wait until then to be re-checked.

## 2013-12-30 DIAGNOSIS — M25559 Pain in unspecified hip: Secondary | ICD-10-CM | POA: Diagnosis not present

## 2013-12-30 DIAGNOSIS — M545 Low back pain, unspecified: Secondary | ICD-10-CM | POA: Diagnosis not present

## 2014-01-03 DIAGNOSIS — M545 Low back pain, unspecified: Secondary | ICD-10-CM | POA: Diagnosis not present

## 2014-01-14 DIAGNOSIS — M47817 Spondylosis without myelopathy or radiculopathy, lumbosacral region: Secondary | ICD-10-CM | POA: Diagnosis not present

## 2014-01-16 DIAGNOSIS — M545 Low back pain, unspecified: Secondary | ICD-10-CM | POA: Diagnosis not present

## 2014-01-16 DIAGNOSIS — M5126 Other intervertebral disc displacement, lumbar region: Secondary | ICD-10-CM | POA: Diagnosis not present

## 2014-01-17 DIAGNOSIS — M5126 Other intervertebral disc displacement, lumbar region: Secondary | ICD-10-CM | POA: Diagnosis not present

## 2014-01-17 DIAGNOSIS — E669 Obesity, unspecified: Secondary | ICD-10-CM | POA: Diagnosis not present

## 2014-01-20 DIAGNOSIS — IMO0002 Reserved for concepts with insufficient information to code with codable children: Secondary | ICD-10-CM | POA: Diagnosis not present

## 2014-01-20 DIAGNOSIS — M5126 Other intervertebral disc displacement, lumbar region: Secondary | ICD-10-CM | POA: Diagnosis not present

## 2014-01-27 ENCOUNTER — Telehealth: Payer: Self-pay | Admitting: Internal Medicine

## 2014-01-27 NOTE — Telephone Encounter (Signed)
Pt is having back surgery on 02/04/14 and wants to know if it is okay to go off coumadin??

## 2014-01-28 DIAGNOSIS — Z01818 Encounter for other preprocedural examination: Secondary | ICD-10-CM | POA: Diagnosis not present

## 2014-01-29 ENCOUNTER — Telehealth: Payer: Self-pay | Admitting: General Practice

## 2014-01-29 NOTE — Telephone Encounter (Signed)
Spoke with patient in regards to holding coumadin for upcoming procedure.  Instructed patient to take last dose of coumadin on Thursday 2/26.  Also instructed patient to take 12.5 mg of coumadin on 3/4 and 3/5 and then to resume current dosage.  Patient verbalized understanding.

## 2014-01-29 NOTE — Telephone Encounter (Signed)
Message copied by Warden Fillers on Wed Jan 29, 2014  5:37 PM ------      Message from: Lisabeth Pick      Created: Wed Jan 29, 2014  5:34 PM       He should not      ----- Message -----         From: Warden Fillers, RN         Sent: 01/27/2014   2:52 PM           To: Lisabeth Pick, MD            Patient is having back surgery on 3/3. Do you feel that he needs a Lovenox bridge?            Thanks,      Villa Herb, RN       ------

## 2014-01-29 NOTE — Telephone Encounter (Signed)
Ok to stop 5 days prior

## 2014-01-29 NOTE — Telephone Encounter (Signed)
Pt aware.

## 2014-01-29 NOTE — Telephone Encounter (Signed)
Attempted to return call to patient in regards to holding coumadin for procedure.  LMOVM

## 2014-01-31 ENCOUNTER — Other Ambulatory Visit: Payer: Self-pay | Admitting: Dermatology

## 2014-01-31 DIAGNOSIS — D485 Neoplasm of uncertain behavior of skin: Secondary | ICD-10-CM | POA: Diagnosis not present

## 2014-01-31 DIAGNOSIS — D1801 Hemangioma of skin and subcutaneous tissue: Secondary | ICD-10-CM | POA: Diagnosis not present

## 2014-01-31 DIAGNOSIS — L570 Actinic keratosis: Secondary | ICD-10-CM | POA: Diagnosis not present

## 2014-01-31 DIAGNOSIS — C44519 Basal cell carcinoma of skin of other part of trunk: Secondary | ICD-10-CM | POA: Diagnosis not present

## 2014-01-31 DIAGNOSIS — Z85828 Personal history of other malignant neoplasm of skin: Secondary | ICD-10-CM | POA: Diagnosis not present

## 2014-01-31 DIAGNOSIS — D239 Other benign neoplasm of skin, unspecified: Secondary | ICD-10-CM | POA: Diagnosis not present

## 2014-01-31 DIAGNOSIS — L821 Other seborrheic keratosis: Secondary | ICD-10-CM | POA: Diagnosis not present

## 2014-01-31 DIAGNOSIS — L259 Unspecified contact dermatitis, unspecified cause: Secondary | ICD-10-CM | POA: Diagnosis not present

## 2014-02-02 HISTORY — PX: BACK SURGERY: SHX140

## 2014-02-03 DIAGNOSIS — C679 Malignant neoplasm of bladder, unspecified: Secondary | ICD-10-CM | POA: Diagnosis not present

## 2014-02-04 DIAGNOSIS — M5126 Other intervertebral disc displacement, lumbar region: Secondary | ICD-10-CM | POA: Diagnosis not present

## 2014-02-13 DIAGNOSIS — C679 Malignant neoplasm of bladder, unspecified: Secondary | ICD-10-CM | POA: Diagnosis not present

## 2014-02-14 DIAGNOSIS — Z85828 Personal history of other malignant neoplasm of skin: Secondary | ICD-10-CM | POA: Diagnosis not present

## 2014-02-14 DIAGNOSIS — C44519 Basal cell carcinoma of skin of other part of trunk: Secondary | ICD-10-CM | POA: Diagnosis not present

## 2014-02-17 ENCOUNTER — Encounter: Payer: Medicare Other | Admitting: Internal Medicine

## 2014-02-19 DIAGNOSIS — C679 Malignant neoplasm of bladder, unspecified: Secondary | ICD-10-CM | POA: Diagnosis not present

## 2014-03-03 ENCOUNTER — Ambulatory Visit (INDEPENDENT_AMBULATORY_CARE_PROVIDER_SITE_OTHER): Payer: Medicare Other | Admitting: Internal Medicine

## 2014-03-03 ENCOUNTER — Encounter: Payer: Self-pay | Admitting: Internal Medicine

## 2014-03-03 VITALS — BP 134/84 | HR 76 | Temp 98.4°F | Ht 71.0 in | Wt 226.0 lb

## 2014-03-03 DIAGNOSIS — E785 Hyperlipidemia, unspecified: Secondary | ICD-10-CM | POA: Diagnosis not present

## 2014-03-03 DIAGNOSIS — I10 Essential (primary) hypertension: Secondary | ICD-10-CM

## 2014-03-03 DIAGNOSIS — C61 Malignant neoplasm of prostate: Secondary | ICD-10-CM

## 2014-03-03 DIAGNOSIS — D6859 Other primary thrombophilia: Secondary | ICD-10-CM | POA: Diagnosis not present

## 2014-03-03 DIAGNOSIS — I4891 Unspecified atrial fibrillation: Secondary | ICD-10-CM

## 2014-03-03 LAB — HEPATIC FUNCTION PANEL
ALBUMIN: 4 g/dL (ref 3.5–5.2)
ALT: 23 U/L (ref 0–53)
AST: 21 U/L (ref 0–37)
Alkaline Phosphatase: 60 U/L (ref 39–117)
BILIRUBIN DIRECT: 0 mg/dL (ref 0.0–0.3)
TOTAL PROTEIN: 7 g/dL (ref 6.0–8.3)
Total Bilirubin: 0.6 mg/dL (ref 0.3–1.2)

## 2014-03-03 LAB — BASIC METABOLIC PANEL
BUN: 16 mg/dL (ref 6–23)
CALCIUM: 9.4 mg/dL (ref 8.4–10.5)
CO2: 27 meq/L (ref 19–32)
Chloride: 103 mEq/L (ref 96–112)
Creatinine, Ser: 1 mg/dL (ref 0.4–1.5)
GFR: 79.82 mL/min (ref >60.00)
Glucose, Bld: 95 mg/dL (ref 70–99)
Potassium: 4.4 mEq/L (ref 3.5–5.1)
SODIUM: 138 meq/L (ref 135–145)

## 2014-03-03 LAB — LIPID PANEL
Cholesterol: 250 mg/dL — ABNORMAL HIGH (ref 0–200)
HDL: 44.7 mg/dL (ref >39.00)
LDL Cholesterol: 173 mg/dL — ABNORMAL HIGH (ref 0–99)
Total CHOL/HDL Ratio: 6
Triglycerides: 164 mg/dL — ABNORMAL HIGH (ref 0.0–149.0)
VLDL: 32.8 mg/dL (ref 0.0–40.0)

## 2014-03-03 LAB — TSH: TSH: 2.65 u[IU]/mL (ref 0.35–5.50)

## 2014-03-03 NOTE — Progress Notes (Signed)
Pre visit review using our clinic review tool, if applicable. No additional management support is needed unless otherwise documented below in the visit note. 

## 2014-03-03 NOTE — Assessment & Plan Note (Signed)
Intolerant to statins Check today Doubt red yeast rice is doing anything of significance

## 2014-03-03 NOTE — Assessment & Plan Note (Signed)
No bleeding complications 
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
## 2014-03-03 NOTE — Assessment & Plan Note (Signed)
He understands it is his responsibility to have PT/INR done at least monthly

## 2014-03-03 NOTE — Assessment & Plan Note (Addendum)
No recurrence- followed by urology

## 2014-03-03 NOTE — Assessment & Plan Note (Signed)
Well controlled Continue same meds 

## 2014-03-04 ENCOUNTER — Encounter: Payer: Medicare Other | Admitting: Internal Medicine

## 2014-03-04 LAB — CBC WITH DIFFERENTIAL/PLATELET
BASOS PCT: 0.7 % (ref 0.0–3.0)
Basophils Absolute: 0 10*3/uL (ref 0.0–0.1)
EOS ABS: 0.3 10*3/uL (ref 0.0–0.7)
EOS PCT: 5 % (ref 0.0–5.0)
HCT: 44.2 % (ref 39.0–52.0)
HEMOGLOBIN: 14.6 g/dL (ref 13.0–17.0)
LYMPHS PCT: 22.1 % (ref 12.0–46.0)
Lymphs Abs: 1.5 10*3/uL (ref 0.7–4.0)
MCHC: 33.1 g/dL (ref 30.0–36.0)
MCV: 88.2 fl (ref 78.0–100.0)
MONOS PCT: 4.6 % (ref 3.0–12.0)
Monocytes Absolute: 0.3 10*3/uL (ref 0.1–1.0)
NEUTROS ABS: 4.5 10*3/uL (ref 1.4–7.7)
NEUTROS PCT: 67.6 % (ref 43.0–77.0)
Platelets: 244 10*3/uL (ref 150.0–400.0)
RBC: 5.01 Mil/uL (ref 4.22–5.81)
RDW: 14.9 % — ABNORMAL HIGH (ref 11.5–14.6)
WBC: 6.6 10*3/uL (ref 4.5–10.5)

## 2014-03-17 ENCOUNTER — Ambulatory Visit (INDEPENDENT_AMBULATORY_CARE_PROVIDER_SITE_OTHER): Payer: Medicare Other | Admitting: General Practice

## 2014-03-18 ENCOUNTER — Other Ambulatory Visit: Payer: Self-pay | Admitting: Internal Medicine

## 2014-03-18 ENCOUNTER — Telehealth: Payer: Self-pay | Admitting: Internal Medicine

## 2014-03-18 NOTE — Telephone Encounter (Signed)
Pt req rx on amphetamine-dextroamphetamine (ADDERALL XR) 25 MG 24 hr capsule

## 2014-03-21 MED ORDER — AMPHETAMINE-DEXTROAMPHET ER 25 MG PO CP24: 25.0000 mg | ORAL_CAPSULE | ORAL | Status: DC

## 2014-03-21 NOTE — Telephone Encounter (Signed)
rx will be ready for p/u on Monday 03/24/14, pt aware

## 2014-03-26 ENCOUNTER — Telehealth: Payer: Self-pay | Admitting: Internal Medicine

## 2014-03-26 NOTE — Telephone Encounter (Signed)
Pt in [place] trying to get warfarin 5 mg refill. Phar needs directions. This rx was prescribed by NP

## 2014-03-26 NOTE — Telephone Encounter (Signed)
Spoke with pt and advised that we cannot fill Rx without an INR done within the past month. He has not had it checked since 08/2013. He states that he has tried to establish with one of his previous MD's in [place]. But has been unable to get an appointment. Message forwarded to Dr. Leanne Chang for review

## 2014-03-27 NOTE — Telephone Encounter (Signed)
Refill x one month only Send an order for an INR to a local lab/hospital. He must have it done. Also... He can go to urgent care center to have it done (would require office visit)

## 2014-03-28 DIAGNOSIS — Z7901 Long term (current) use of anticoagulants: Secondary | ICD-10-CM | POA: Diagnosis not present

## 2014-03-28 DIAGNOSIS — D6859 Other primary thrombophilia: Secondary | ICD-10-CM | POA: Diagnosis not present

## 2014-03-28 DIAGNOSIS — I10 Essential (primary) hypertension: Secondary | ICD-10-CM | POA: Diagnosis not present

## 2014-03-28 NOTE — Telephone Encounter (Signed)
Pt seen a doctor in [place] and he got a refill, nothing further was needed

## 2014-04-18 DIAGNOSIS — Z85828 Personal history of other malignant neoplasm of skin: Secondary | ICD-10-CM | POA: Diagnosis not present

## 2014-04-18 DIAGNOSIS — B070 Plantar wart: Secondary | ICD-10-CM | POA: Diagnosis not present

## 2014-05-06 ENCOUNTER — Other Ambulatory Visit: Payer: Self-pay | Admitting: Internal Medicine

## 2014-06-11 DIAGNOSIS — Z7901 Long term (current) use of anticoagulants: Secondary | ICD-10-CM | POA: Diagnosis not present

## 2014-06-27 DIAGNOSIS — L821 Other seborrheic keratosis: Secondary | ICD-10-CM | POA: Diagnosis not present

## 2014-06-27 DIAGNOSIS — L909 Atrophic disorder of skin, unspecified: Secondary | ICD-10-CM | POA: Diagnosis not present

## 2014-06-27 DIAGNOSIS — D1801 Hemangioma of skin and subcutaneous tissue: Secondary | ICD-10-CM | POA: Diagnosis not present

## 2014-06-27 DIAGNOSIS — L570 Actinic keratosis: Secondary | ICD-10-CM | POA: Diagnosis not present

## 2014-06-27 DIAGNOSIS — L919 Hypertrophic disorder of the skin, unspecified: Secondary | ICD-10-CM | POA: Diagnosis not present

## 2014-06-27 DIAGNOSIS — Z85828 Personal history of other malignant neoplasm of skin: Secondary | ICD-10-CM | POA: Diagnosis not present

## 2014-07-02 ENCOUNTER — Encounter: Payer: Self-pay | Admitting: Internal Medicine

## 2014-07-24 DIAGNOSIS — D090 Carcinoma in situ of bladder: Secondary | ICD-10-CM | POA: Diagnosis not present

## 2014-07-24 DIAGNOSIS — C679 Malignant neoplasm of bladder, unspecified: Secondary | ICD-10-CM | POA: Diagnosis not present

## 2014-07-24 DIAGNOSIS — N138 Other obstructive and reflux uropathy: Secondary | ICD-10-CM | POA: Diagnosis not present

## 2014-07-24 DIAGNOSIS — C61 Malignant neoplasm of prostate: Secondary | ICD-10-CM | POA: Diagnosis not present

## 2014-07-24 DIAGNOSIS — N401 Enlarged prostate with lower urinary tract symptoms: Secondary | ICD-10-CM | POA: Diagnosis not present

## 2014-07-30 DIAGNOSIS — R82998 Other abnormal findings in urine: Secondary | ICD-10-CM | POA: Diagnosis not present

## 2014-07-30 DIAGNOSIS — D090 Carcinoma in situ of bladder: Secondary | ICD-10-CM | POA: Diagnosis not present

## 2014-08-06 DIAGNOSIS — D090 Carcinoma in situ of bladder: Secondary | ICD-10-CM | POA: Diagnosis not present

## 2014-08-08 ENCOUNTER — Other Ambulatory Visit: Payer: Self-pay | Admitting: Internal Medicine

## 2014-08-19 ENCOUNTER — Telehealth: Payer: Self-pay | Admitting: Internal Medicine

## 2014-08-19 NOTE — Telephone Encounter (Signed)
Needs adderall xr 25 (generic0 refilled. Advised that he needs to est care with another provider. Stated he was not informed that Dr swords was leaving/gone.

## 2014-08-20 ENCOUNTER — Ambulatory Visit (INDEPENDENT_AMBULATORY_CARE_PROVIDER_SITE_OTHER): Payer: Medicare Other | Admitting: Family

## 2014-08-20 DIAGNOSIS — I4891 Unspecified atrial fibrillation: Secondary | ICD-10-CM

## 2014-08-20 DIAGNOSIS — I482 Chronic atrial fibrillation, unspecified: Secondary | ICD-10-CM

## 2014-08-20 DIAGNOSIS — D6859 Other primary thrombophilia: Secondary | ICD-10-CM

## 2014-08-20 LAB — POCT INR: INR: 3.6

## 2014-08-20 NOTE — Patient Instructions (Signed)
Hold coumadin Thursday. Then continue current dosage. Recheck in 2 weeks.   Anticoagulation Dose Instructions as of 08/20/2014     Mathew Randolph Tue Wed Thu Fri Sat   New Dose 7.5 mg 10 mg 7.5 mg 10 mg 7.5 mg 10 mg 7.5 mg    Description       Hold coumadin Thursday. Then continue current dosage. Recheck in 2 weeks.

## 2014-08-21 DIAGNOSIS — H2510 Age-related nuclear cataract, unspecified eye: Secondary | ICD-10-CM | POA: Diagnosis not present

## 2014-08-21 NOTE — Telephone Encounter (Signed)
Schedule him an appt with Dr Yong Channel to establish and I will see if I can someone to sign for it

## 2014-08-25 NOTE — Telephone Encounter (Signed)
Lm on vm Friday for pt to cb and make an appt.

## 2014-09-01 DIAGNOSIS — M1990 Unspecified osteoarthritis, unspecified site: Secondary | ICD-10-CM | POA: Diagnosis not present

## 2014-09-01 DIAGNOSIS — K219 Gastro-esophageal reflux disease without esophagitis: Secondary | ICD-10-CM | POA: Diagnosis not present

## 2014-09-01 DIAGNOSIS — I10 Essential (primary) hypertension: Secondary | ICD-10-CM | POA: Diagnosis not present

## 2014-09-01 DIAGNOSIS — D68312 Antiphospholipid antibody with hemorrhagic disorder: Secondary | ICD-10-CM | POA: Diagnosis not present

## 2014-09-04 ENCOUNTER — Ambulatory Visit: Payer: Medicare Other

## 2014-09-09 DIAGNOSIS — Z7901 Long term (current) use of anticoagulants: Secondary | ICD-10-CM | POA: Diagnosis not present

## 2014-09-09 DIAGNOSIS — D68312 Antiphospholipid antibody with hemorrhagic disorder: Secondary | ICD-10-CM | POA: Diagnosis not present

## 2014-10-02 ENCOUNTER — Telehealth: Payer: Self-pay | Admitting: Family

## 2014-10-02 NOTE — Telephone Encounter (Signed)
Please call and set up a PT/INR visit for patient asap.

## 2014-10-07 ENCOUNTER — Ambulatory Visit: Payer: Self-pay | Admitting: Family

## 2014-10-07 DIAGNOSIS — I482 Chronic atrial fibrillation, unspecified: Secondary | ICD-10-CM

## 2014-10-07 DIAGNOSIS — D6859 Other primary thrombophilia: Secondary | ICD-10-CM

## 2014-10-07 NOTE — Telephone Encounter (Signed)
Pt said he is getting this done at [facility]

## 2014-11-04 DIAGNOSIS — R6889 Other general symptoms and signs: Secondary | ICD-10-CM | POA: Diagnosis not present

## 2014-11-04 DIAGNOSIS — J069 Acute upper respiratory infection, unspecified: Secondary | ICD-10-CM | POA: Diagnosis not present

## 2014-11-04 DIAGNOSIS — I10 Essential (primary) hypertension: Secondary | ICD-10-CM | POA: Diagnosis not present

## 2014-11-04 DIAGNOSIS — F39 Unspecified mood [affective] disorder: Secondary | ICD-10-CM | POA: Diagnosis not present

## 2014-11-06 DIAGNOSIS — F39 Unspecified mood [affective] disorder: Secondary | ICD-10-CM | POA: Diagnosis not present

## 2014-11-06 DIAGNOSIS — I10 Essential (primary) hypertension: Secondary | ICD-10-CM | POA: Diagnosis not present

## 2014-11-18 DIAGNOSIS — R42 Dizziness and giddiness: Secondary | ICD-10-CM | POA: Diagnosis not present

## 2014-11-18 DIAGNOSIS — M25552 Pain in left hip: Secondary | ICD-10-CM | POA: Diagnosis not present

## 2014-11-18 DIAGNOSIS — Z23 Encounter for immunization: Secondary | ICD-10-CM | POA: Diagnosis not present

## 2014-11-18 DIAGNOSIS — D68312 Antiphospholipid antibody with hemorrhagic disorder: Secondary | ICD-10-CM | POA: Diagnosis not present

## 2014-11-18 DIAGNOSIS — I10 Essential (primary) hypertension: Secondary | ICD-10-CM | POA: Diagnosis not present

## 2014-11-25 DIAGNOSIS — I10 Essential (primary) hypertension: Secondary | ICD-10-CM | POA: Diagnosis not present

## 2014-11-25 DIAGNOSIS — S300XXA Contusion of lower back and pelvis, initial encounter: Secondary | ICD-10-CM | POA: Diagnosis not present

## 2014-11-25 DIAGNOSIS — M76892 Other specified enthesopathies of left lower limb, excluding foot: Secondary | ICD-10-CM | POA: Diagnosis not present

## 2014-11-25 DIAGNOSIS — Z7901 Long term (current) use of anticoagulants: Secondary | ICD-10-CM | POA: Diagnosis not present

## 2014-11-25 DIAGNOSIS — M25552 Pain in left hip: Secondary | ICD-10-CM | POA: Diagnosis not present

## 2014-11-25 DIAGNOSIS — H9211 Otorrhea, right ear: Secondary | ICD-10-CM | POA: Diagnosis not present

## 2014-12-17 DIAGNOSIS — D6862 Lupus anticoagulant syndrome: Secondary | ICD-10-CM | POA: Diagnosis not present

## 2014-12-23 DIAGNOSIS — D6859 Other primary thrombophilia: Secondary | ICD-10-CM | POA: Diagnosis not present

## 2014-12-23 DIAGNOSIS — Z7901 Long term (current) use of anticoagulants: Secondary | ICD-10-CM | POA: Diagnosis not present

## 2015-01-06 DIAGNOSIS — D6862 Lupus anticoagulant syndrome: Secondary | ICD-10-CM | POA: Diagnosis not present

## 2015-02-04 DIAGNOSIS — R918 Other nonspecific abnormal finding of lung field: Secondary | ICD-10-CM | POA: Diagnosis not present

## 2015-02-04 DIAGNOSIS — Z8546 Personal history of malignant neoplasm of prostate: Secondary | ICD-10-CM | POA: Diagnosis not present

## 2015-02-04 DIAGNOSIS — Z8551 Personal history of malignant neoplasm of bladder: Secondary | ICD-10-CM | POA: Diagnosis not present

## 2015-02-04 DIAGNOSIS — D6862 Lupus anticoagulant syndrome: Secondary | ICD-10-CM | POA: Diagnosis not present

## 2015-02-04 DIAGNOSIS — J40 Bronchitis, not specified as acute or chronic: Secondary | ICD-10-CM | POA: Diagnosis not present

## 2015-02-09 DIAGNOSIS — D6862 Lupus anticoagulant syndrome: Secondary | ICD-10-CM | POA: Diagnosis not present

## 2015-02-12 DIAGNOSIS — R918 Other nonspecific abnormal finding of lung field: Secondary | ICD-10-CM | POA: Diagnosis not present

## 2015-02-12 DIAGNOSIS — J45909 Unspecified asthma, uncomplicated: Secondary | ICD-10-CM | POA: Diagnosis not present

## 2015-02-12 DIAGNOSIS — J302 Other seasonal allergic rhinitis: Secondary | ICD-10-CM | POA: Diagnosis not present

## 2015-02-12 DIAGNOSIS — Z7901 Long term (current) use of anticoagulants: Secondary | ICD-10-CM | POA: Diagnosis not present

## 2015-02-26 DIAGNOSIS — Z7901 Long term (current) use of anticoagulants: Secondary | ICD-10-CM | POA: Diagnosis not present

## 2015-03-25 DIAGNOSIS — Z7901 Long term (current) use of anticoagulants: Secondary | ICD-10-CM | POA: Diagnosis not present

## 2015-03-25 DIAGNOSIS — Z8546 Personal history of malignant neoplasm of prostate: Secondary | ICD-10-CM | POA: Diagnosis not present

## 2015-03-25 DIAGNOSIS — Z125 Encounter for screening for malignant neoplasm of prostate: Secondary | ICD-10-CM | POA: Diagnosis not present

## 2015-03-31 DIAGNOSIS — M1712 Unilateral primary osteoarthritis, left knee: Secondary | ICD-10-CM | POA: Diagnosis not present

## 2015-03-31 DIAGNOSIS — M7551 Bursitis of right shoulder: Secondary | ICD-10-CM | POA: Diagnosis not present

## 2015-04-01 DIAGNOSIS — Z86008 Personal history of in-situ neoplasm of other site: Secondary | ICD-10-CM | POA: Diagnosis not present

## 2015-04-01 DIAGNOSIS — Z8546 Personal history of malignant neoplasm of prostate: Secondary | ICD-10-CM | POA: Diagnosis not present

## 2015-04-01 DIAGNOSIS — N99111 Postprocedural bulbous urethral stricture: Secondary | ICD-10-CM | POA: Diagnosis not present

## 2015-04-02 ENCOUNTER — Other Ambulatory Visit: Payer: Self-pay | Admitting: Dermatology

## 2015-04-02 DIAGNOSIS — D485 Neoplasm of uncertain behavior of skin: Secondary | ICD-10-CM | POA: Diagnosis not present

## 2015-04-02 DIAGNOSIS — L280 Lichen simplex chronicus: Secondary | ICD-10-CM | POA: Diagnosis not present

## 2015-04-02 DIAGNOSIS — Z85828 Personal history of other malignant neoplasm of skin: Secondary | ICD-10-CM | POA: Diagnosis not present

## 2015-04-22 DIAGNOSIS — R05 Cough: Secondary | ICD-10-CM | POA: Diagnosis not present

## 2015-04-22 DIAGNOSIS — J209 Acute bronchitis, unspecified: Secondary | ICD-10-CM | POA: Diagnosis not present

## 2015-04-22 DIAGNOSIS — R918 Other nonspecific abnormal finding of lung field: Secondary | ICD-10-CM | POA: Diagnosis not present

## 2015-04-22 DIAGNOSIS — R0989 Other specified symptoms and signs involving the circulatory and respiratory systems: Secondary | ICD-10-CM | POA: Diagnosis not present

## 2015-04-24 DIAGNOSIS — R918 Other nonspecific abnormal finding of lung field: Secondary | ICD-10-CM | POA: Diagnosis not present

## 2015-04-24 DIAGNOSIS — M8448XA Pathological fracture, other site, initial encounter for fracture: Secondary | ICD-10-CM | POA: Diagnosis not present

## 2015-04-24 DIAGNOSIS — I517 Cardiomegaly: Secondary | ICD-10-CM | POA: Diagnosis not present

## 2015-04-29 ENCOUNTER — Encounter: Payer: Self-pay | Admitting: Gastroenterology

## 2015-05-13 DIAGNOSIS — Z0000 Encounter for general adult medical examination without abnormal findings: Secondary | ICD-10-CM | POA: Diagnosis not present

## 2015-05-13 DIAGNOSIS — Z7901 Long term (current) use of anticoagulants: Secondary | ICD-10-CM | POA: Diagnosis not present

## 2015-05-13 DIAGNOSIS — K219 Gastro-esophageal reflux disease without esophagitis: Secondary | ICD-10-CM | POA: Diagnosis not present

## 2015-05-13 DIAGNOSIS — I10 Essential (primary) hypertension: Secondary | ICD-10-CM | POA: Diagnosis not present

## 2015-05-19 DIAGNOSIS — I10 Essential (primary) hypertension: Secondary | ICD-10-CM | POA: Diagnosis not present

## 2015-05-19 DIAGNOSIS — K219 Gastro-esophageal reflux disease without esophagitis: Secondary | ICD-10-CM | POA: Diagnosis not present

## 2015-05-19 DIAGNOSIS — M150 Primary generalized (osteo)arthritis: Secondary | ICD-10-CM | POA: Diagnosis not present

## 2015-05-19 DIAGNOSIS — D68312 Antiphospholipid antibody with hemorrhagic disorder: Secondary | ICD-10-CM | POA: Diagnosis not present

## 2015-06-15 DIAGNOSIS — M7551 Bursitis of right shoulder: Secondary | ICD-10-CM | POA: Diagnosis not present

## 2015-06-16 ENCOUNTER — Other Ambulatory Visit: Payer: Self-pay | Admitting: Orthopaedic Surgery

## 2015-06-16 DIAGNOSIS — M25511 Pain in right shoulder: Secondary | ICD-10-CM

## 2015-06-18 ENCOUNTER — Other Ambulatory Visit: Payer: Medicare Other

## 2015-06-18 ENCOUNTER — Ambulatory Visit
Admission: RE | Admit: 2015-06-18 | Discharge: 2015-06-18 | Disposition: A | Discharge status: Home / Self Care | Payer: Medicare Other | Source: Ambulatory Visit | Attending: Orthopaedic Surgery | Admitting: Orthopaedic Surgery

## 2015-06-18 DIAGNOSIS — Z8546 Personal history of malignant neoplasm of prostate: Secondary | ICD-10-CM | POA: Diagnosis not present

## 2015-06-18 DIAGNOSIS — M25511 Pain in right shoulder: Secondary | ICD-10-CM

## 2015-06-18 DIAGNOSIS — M19011 Primary osteoarthritis, right shoulder: Secondary | ICD-10-CM | POA: Diagnosis not present

## 2015-06-18 DIAGNOSIS — M75101 Unspecified rotator cuff tear or rupture of right shoulder, not specified as traumatic: Secondary | ICD-10-CM | POA: Diagnosis not present

## 2015-06-24 DIAGNOSIS — M7551 Bursitis of right shoulder: Secondary | ICD-10-CM | POA: Diagnosis not present

## 2015-07-27 DIAGNOSIS — M7541 Impingement syndrome of right shoulder: Secondary | ICD-10-CM | POA: Diagnosis not present

## 2015-07-27 DIAGNOSIS — M1712 Unilateral primary osteoarthritis, left knee: Secondary | ICD-10-CM | POA: Diagnosis not present

## 2015-08-03 DIAGNOSIS — D68312 Antiphospholipid antibody with hemorrhagic disorder: Secondary | ICD-10-CM | POA: Diagnosis not present

## 2015-08-03 DIAGNOSIS — M1712 Unilateral primary osteoarthritis, left knee: Secondary | ICD-10-CM | POA: Diagnosis not present

## 2015-08-03 DIAGNOSIS — Z7901 Long term (current) use of anticoagulants: Secondary | ICD-10-CM | POA: Diagnosis not present

## 2015-08-13 DIAGNOSIS — M75111 Incomplete rotator cuff tear or rupture of right shoulder, not specified as traumatic: Secondary | ICD-10-CM | POA: Diagnosis not present

## 2015-08-13 DIAGNOSIS — G8918 Other acute postprocedural pain: Secondary | ICD-10-CM | POA: Diagnosis not present

## 2015-08-13 DIAGNOSIS — M19011 Primary osteoarthritis, right shoulder: Secondary | ICD-10-CM | POA: Diagnosis not present

## 2015-08-13 DIAGNOSIS — M7541 Impingement syndrome of right shoulder: Secondary | ICD-10-CM | POA: Diagnosis not present

## 2015-08-13 DIAGNOSIS — M24111 Other articular cartilage disorders, right shoulder: Secondary | ICD-10-CM | POA: Diagnosis not present

## 2015-08-13 DIAGNOSIS — S43431A Superior glenoid labrum lesion of right shoulder, initial encounter: Secondary | ICD-10-CM | POA: Diagnosis not present

## 2015-08-17 DIAGNOSIS — M1712 Unilateral primary osteoarthritis, left knee: Secondary | ICD-10-CM | POA: Diagnosis not present

## 2015-08-19 DIAGNOSIS — M25611 Stiffness of right shoulder, not elsewhere classified: Secondary | ICD-10-CM | POA: Diagnosis not present

## 2015-08-19 DIAGNOSIS — R293 Abnormal posture: Secondary | ICD-10-CM | POA: Diagnosis not present

## 2015-08-19 DIAGNOSIS — R6889 Other general symptoms and signs: Secondary | ICD-10-CM | POA: Diagnosis not present

## 2015-08-19 DIAGNOSIS — M6281 Muscle weakness (generalized): Secondary | ICD-10-CM | POA: Diagnosis not present

## 2015-08-19 DIAGNOSIS — Z9889 Other specified postprocedural states: Secondary | ICD-10-CM | POA: Diagnosis not present

## 2015-08-19 DIAGNOSIS — M25511 Pain in right shoulder: Secondary | ICD-10-CM | POA: Diagnosis not present

## 2015-08-19 DIAGNOSIS — G8929 Other chronic pain: Secondary | ICD-10-CM | POA: Diagnosis not present

## 2015-08-21 DIAGNOSIS — R293 Abnormal posture: Secondary | ICD-10-CM | POA: Diagnosis not present

## 2015-08-21 DIAGNOSIS — Z9889 Other specified postprocedural states: Secondary | ICD-10-CM | POA: Diagnosis not present

## 2015-08-21 DIAGNOSIS — M6281 Muscle weakness (generalized): Secondary | ICD-10-CM | POA: Diagnosis not present

## 2015-08-21 DIAGNOSIS — M25511 Pain in right shoulder: Secondary | ICD-10-CM | POA: Diagnosis not present

## 2015-08-21 DIAGNOSIS — R6889 Other general symptoms and signs: Secondary | ICD-10-CM | POA: Diagnosis not present

## 2015-08-21 DIAGNOSIS — M25611 Stiffness of right shoulder, not elsewhere classified: Secondary | ICD-10-CM | POA: Diagnosis not present

## 2015-08-24 DIAGNOSIS — M1712 Unilateral primary osteoarthritis, left knee: Secondary | ICD-10-CM | POA: Diagnosis not present

## 2015-08-25 DIAGNOSIS — R293 Abnormal posture: Secondary | ICD-10-CM | POA: Diagnosis not present

## 2015-08-25 DIAGNOSIS — M25511 Pain in right shoulder: Secondary | ICD-10-CM | POA: Diagnosis not present

## 2015-08-25 DIAGNOSIS — M25611 Stiffness of right shoulder, not elsewhere classified: Secondary | ICD-10-CM | POA: Diagnosis not present

## 2015-08-25 DIAGNOSIS — Z9889 Other specified postprocedural states: Secondary | ICD-10-CM | POA: Diagnosis not present

## 2015-08-25 DIAGNOSIS — R6889 Other general symptoms and signs: Secondary | ICD-10-CM | POA: Diagnosis not present

## 2015-08-25 DIAGNOSIS — M6281 Muscle weakness (generalized): Secondary | ICD-10-CM | POA: Diagnosis not present

## 2015-08-28 DIAGNOSIS — D225 Melanocytic nevi of trunk: Secondary | ICD-10-CM | POA: Diagnosis not present

## 2015-08-28 DIAGNOSIS — L814 Other melanin hyperpigmentation: Secondary | ICD-10-CM | POA: Diagnosis not present

## 2015-08-28 DIAGNOSIS — Z85828 Personal history of other malignant neoplasm of skin: Secondary | ICD-10-CM | POA: Diagnosis not present

## 2015-08-28 DIAGNOSIS — L821 Other seborrheic keratosis: Secondary | ICD-10-CM | POA: Diagnosis not present

## 2015-09-01 DIAGNOSIS — J209 Acute bronchitis, unspecified: Secondary | ICD-10-CM | POA: Diagnosis not present

## 2015-09-09 DIAGNOSIS — Z86008 Personal history of in-situ neoplasm of other site: Secondary | ICD-10-CM | POA: Diagnosis not present

## 2015-09-09 DIAGNOSIS — R828 Abnormal findings on cytological and histological examination of urine: Secondary | ICD-10-CM | POA: Diagnosis not present

## 2015-09-09 DIAGNOSIS — N99111 Postprocedural bulbous urethral stricture: Secondary | ICD-10-CM | POA: Diagnosis not present

## 2015-09-09 DIAGNOSIS — N323 Diverticulum of bladder: Secondary | ICD-10-CM | POA: Diagnosis not present

## 2015-09-15 DIAGNOSIS — J209 Acute bronchitis, unspecified: Secondary | ICD-10-CM | POA: Diagnosis not present

## 2015-09-16 DIAGNOSIS — K219 Gastro-esophageal reflux disease without esophagitis: Secondary | ICD-10-CM | POA: Diagnosis not present

## 2015-09-16 DIAGNOSIS — M150 Primary generalized (osteo)arthritis: Secondary | ICD-10-CM | POA: Diagnosis not present

## 2015-09-16 DIAGNOSIS — Z7901 Long term (current) use of anticoagulants: Secondary | ICD-10-CM | POA: Diagnosis not present

## 2015-09-16 DIAGNOSIS — N400 Enlarged prostate without lower urinary tract symptoms: Secondary | ICD-10-CM | POA: Diagnosis not present

## 2015-09-16 DIAGNOSIS — I10 Essential (primary) hypertension: Secondary | ICD-10-CM | POA: Diagnosis not present

## 2015-09-18 DIAGNOSIS — R600 Localized edema: Secondary | ICD-10-CM | POA: Diagnosis not present

## 2015-09-18 DIAGNOSIS — Z23 Encounter for immunization: Secondary | ICD-10-CM | POA: Diagnosis not present

## 2015-09-18 DIAGNOSIS — I10 Essential (primary) hypertension: Secondary | ICD-10-CM | POA: Diagnosis not present

## 2015-09-18 DIAGNOSIS — J45901 Unspecified asthma with (acute) exacerbation: Secondary | ICD-10-CM | POA: Diagnosis not present

## 2015-09-18 DIAGNOSIS — R0602 Shortness of breath: Secondary | ICD-10-CM | POA: Diagnosis not present

## 2015-09-22 ENCOUNTER — Other Ambulatory Visit (HOSPITAL_COMMUNITY): Payer: Self-pay | Admitting: Internal Medicine

## 2015-09-22 DIAGNOSIS — R0602 Shortness of breath: Secondary | ICD-10-CM

## 2015-09-22 DIAGNOSIS — I10 Essential (primary) hypertension: Secondary | ICD-10-CM

## 2015-09-22 DIAGNOSIS — R600 Localized edema: Secondary | ICD-10-CM

## 2015-09-28 ENCOUNTER — Other Ambulatory Visit: Payer: Self-pay

## 2015-09-28 ENCOUNTER — Ambulatory Visit (HOSPITAL_COMMUNITY): Payer: Medicare Other | Attending: Cardiovascular Disease

## 2015-09-28 DIAGNOSIS — I10 Essential (primary) hypertension: Secondary | ICD-10-CM

## 2015-09-28 DIAGNOSIS — E785 Hyperlipidemia, unspecified: Secondary | ICD-10-CM | POA: Insufficient documentation

## 2015-09-28 DIAGNOSIS — I77810 Thoracic aortic ectasia: Secondary | ICD-10-CM | POA: Diagnosis not present

## 2015-09-28 DIAGNOSIS — R600 Localized edema: Secondary | ICD-10-CM

## 2015-09-28 DIAGNOSIS — Z87891 Personal history of nicotine dependence: Secondary | ICD-10-CM | POA: Diagnosis not present

## 2015-09-28 DIAGNOSIS — R0602 Shortness of breath: Secondary | ICD-10-CM | POA: Diagnosis not present

## 2015-09-28 DIAGNOSIS — I358 Other nonrheumatic aortic valve disorders: Secondary | ICD-10-CM | POA: Insufficient documentation

## 2015-09-28 DIAGNOSIS — I071 Rheumatic tricuspid insufficiency: Secondary | ICD-10-CM | POA: Insufficient documentation

## 2015-09-28 DIAGNOSIS — I517 Cardiomegaly: Secondary | ICD-10-CM | POA: Diagnosis not present

## 2015-09-28 DIAGNOSIS — I340 Nonrheumatic mitral (valve) insufficiency: Secondary | ICD-10-CM | POA: Insufficient documentation

## 2015-09-29 DIAGNOSIS — R600 Localized edema: Secondary | ICD-10-CM | POA: Diagnosis not present

## 2015-09-29 DIAGNOSIS — Z9889 Other specified postprocedural states: Secondary | ICD-10-CM | POA: Diagnosis not present

## 2015-09-29 DIAGNOSIS — M6281 Muscle weakness (generalized): Secondary | ICD-10-CM | POA: Diagnosis not present

## 2015-09-29 DIAGNOSIS — M25611 Stiffness of right shoulder, not elsewhere classified: Secondary | ICD-10-CM | POA: Diagnosis not present

## 2015-09-29 DIAGNOSIS — N400 Enlarged prostate without lower urinary tract symptoms: Secondary | ICD-10-CM | POA: Diagnosis not present

## 2015-09-29 DIAGNOSIS — R293 Abnormal posture: Secondary | ICD-10-CM | POA: Diagnosis not present

## 2015-09-29 DIAGNOSIS — I10 Essential (primary) hypertension: Secondary | ICD-10-CM | POA: Diagnosis not present

## 2015-09-29 DIAGNOSIS — G8929 Other chronic pain: Secondary | ICD-10-CM | POA: Diagnosis not present

## 2015-09-29 DIAGNOSIS — R6889 Other general symptoms and signs: Secondary | ICD-10-CM | POA: Diagnosis not present

## 2015-09-29 DIAGNOSIS — M25511 Pain in right shoulder: Secondary | ICD-10-CM | POA: Diagnosis not present

## 2015-09-29 DIAGNOSIS — D68312 Antiphospholipid antibody with hemorrhagic disorder: Secondary | ICD-10-CM | POA: Diagnosis not present

## 2015-10-01 DIAGNOSIS — Z9889 Other specified postprocedural states: Secondary | ICD-10-CM | POA: Diagnosis not present

## 2015-10-01 DIAGNOSIS — R293 Abnormal posture: Secondary | ICD-10-CM | POA: Diagnosis not present

## 2015-10-01 DIAGNOSIS — M6281 Muscle weakness (generalized): Secondary | ICD-10-CM | POA: Diagnosis not present

## 2015-10-01 DIAGNOSIS — M25511 Pain in right shoulder: Secondary | ICD-10-CM | POA: Diagnosis not present

## 2015-10-01 DIAGNOSIS — R6889 Other general symptoms and signs: Secondary | ICD-10-CM | POA: Diagnosis not present

## 2015-10-01 DIAGNOSIS — M25611 Stiffness of right shoulder, not elsewhere classified: Secondary | ICD-10-CM | POA: Diagnosis not present

## 2015-10-07 DIAGNOSIS — Z7901 Long term (current) use of anticoagulants: Secondary | ICD-10-CM | POA: Diagnosis not present

## 2015-10-07 DIAGNOSIS — J45901 Unspecified asthma with (acute) exacerbation: Secondary | ICD-10-CM | POA: Diagnosis not present

## 2015-10-07 DIAGNOSIS — J209 Acute bronchitis, unspecified: Secondary | ICD-10-CM | POA: Diagnosis not present

## 2015-10-07 DIAGNOSIS — R600 Localized edema: Secondary | ICD-10-CM | POA: Diagnosis not present

## 2015-10-07 DIAGNOSIS — K591 Functional diarrhea: Secondary | ICD-10-CM | POA: Diagnosis not present

## 2015-10-07 DIAGNOSIS — K219 Gastro-esophageal reflux disease without esophagitis: Secondary | ICD-10-CM | POA: Diagnosis not present

## 2015-10-07 DIAGNOSIS — R05 Cough: Secondary | ICD-10-CM | POA: Diagnosis not present

## 2015-10-07 DIAGNOSIS — R0602 Shortness of breath: Secondary | ICD-10-CM | POA: Diagnosis not present

## 2015-10-07 DIAGNOSIS — D6862 Lupus anticoagulant syndrome: Secondary | ICD-10-CM | POA: Diagnosis not present

## 2015-10-07 DIAGNOSIS — J45909 Unspecified asthma, uncomplicated: Secondary | ICD-10-CM | POA: Diagnosis not present

## 2015-10-07 DIAGNOSIS — Z87891 Personal history of nicotine dependence: Secondary | ICD-10-CM | POA: Diagnosis not present

## 2015-10-07 DIAGNOSIS — I509 Heart failure, unspecified: Secondary | ICD-10-CM | POA: Diagnosis not present

## 2015-10-07 DIAGNOSIS — R000 Tachycardia, unspecified: Secondary | ICD-10-CM | POA: Diagnosis not present

## 2015-10-07 DIAGNOSIS — I10 Essential (primary) hypertension: Secondary | ICD-10-CM | POA: Diagnosis not present

## 2015-10-07 DIAGNOSIS — R079 Chest pain, unspecified: Secondary | ICD-10-CM | POA: Diagnosis not present

## 2015-10-07 DIAGNOSIS — R062 Wheezing: Secondary | ICD-10-CM | POA: Diagnosis not present

## 2015-10-07 DIAGNOSIS — A09 Infectious gastroenteritis and colitis, unspecified: Secondary | ICD-10-CM | POA: Diagnosis not present

## 2015-10-07 DIAGNOSIS — R0600 Dyspnea, unspecified: Secondary | ICD-10-CM | POA: Diagnosis not present

## 2015-10-08 DIAGNOSIS — A09 Infectious gastroenteritis and colitis, unspecified: Secondary | ICD-10-CM | POA: Diagnosis not present

## 2015-10-08 DIAGNOSIS — J4551 Severe persistent asthma with (acute) exacerbation: Secondary | ICD-10-CM | POA: Diagnosis not present

## 2015-10-08 DIAGNOSIS — R600 Localized edema: Secondary | ICD-10-CM | POA: Diagnosis not present

## 2015-10-08 DIAGNOSIS — J209 Acute bronchitis, unspecified: Secondary | ICD-10-CM | POA: Diagnosis not present

## 2015-10-08 DIAGNOSIS — R062 Wheezing: Secondary | ICD-10-CM | POA: Diagnosis not present

## 2015-10-08 DIAGNOSIS — J45909 Unspecified asthma, uncomplicated: Secondary | ICD-10-CM | POA: Diagnosis not present

## 2015-10-08 DIAGNOSIS — J45901 Unspecified asthma with (acute) exacerbation: Secondary | ICD-10-CM | POA: Diagnosis not present

## 2015-10-08 DIAGNOSIS — R05 Cough: Secondary | ICD-10-CM | POA: Diagnosis not present

## 2015-10-09 DIAGNOSIS — J4551 Severe persistent asthma with (acute) exacerbation: Secondary | ICD-10-CM | POA: Diagnosis not present

## 2015-10-09 DIAGNOSIS — A09 Infectious gastroenteritis and colitis, unspecified: Secondary | ICD-10-CM | POA: Diagnosis not present

## 2015-10-09 DIAGNOSIS — R600 Localized edema: Secondary | ICD-10-CM | POA: Diagnosis not present

## 2015-10-09 DIAGNOSIS — J45909 Unspecified asthma, uncomplicated: Secondary | ICD-10-CM | POA: Diagnosis not present

## 2015-10-09 DIAGNOSIS — J209 Acute bronchitis, unspecified: Secondary | ICD-10-CM | POA: Diagnosis not present

## 2015-10-09 DIAGNOSIS — J45901 Unspecified asthma with (acute) exacerbation: Secondary | ICD-10-CM | POA: Diagnosis not present

## 2015-10-12 DIAGNOSIS — G4733 Obstructive sleep apnea (adult) (pediatric): Secondary | ICD-10-CM | POA: Diagnosis not present

## 2015-10-12 DIAGNOSIS — E669 Obesity, unspecified: Secondary | ICD-10-CM | POA: Diagnosis not present

## 2015-10-12 DIAGNOSIS — R400 Somnolence: Secondary | ICD-10-CM | POA: Diagnosis not present

## 2015-10-13 DIAGNOSIS — G4733 Obstructive sleep apnea (adult) (pediatric): Secondary | ICD-10-CM | POA: Diagnosis not present

## 2015-10-16 DIAGNOSIS — R0602 Shortness of breath: Secondary | ICD-10-CM | POA: Diagnosis not present

## 2015-10-19 DIAGNOSIS — I4891 Unspecified atrial fibrillation: Secondary | ICD-10-CM | POA: Diagnosis not present

## 2015-10-19 DIAGNOSIS — I10 Essential (primary) hypertension: Secondary | ICD-10-CM | POA: Diagnosis not present

## 2015-10-19 DIAGNOSIS — J301 Allergic rhinitis due to pollen: Secondary | ICD-10-CM | POA: Diagnosis not present

## 2015-10-19 DIAGNOSIS — J45991 Cough variant asthma: Secondary | ICD-10-CM | POA: Diagnosis not present

## 2015-10-19 DIAGNOSIS — R400 Somnolence: Secondary | ICD-10-CM | POA: Diagnosis not present

## 2015-10-19 DIAGNOSIS — G4733 Obstructive sleep apnea (adult) (pediatric): Secondary | ICD-10-CM | POA: Diagnosis not present

## 2015-10-19 DIAGNOSIS — E669 Obesity, unspecified: Secondary | ICD-10-CM | POA: Diagnosis not present

## 2015-11-03 DIAGNOSIS — G8929 Other chronic pain: Secondary | ICD-10-CM | POA: Diagnosis not present

## 2015-11-03 DIAGNOSIS — R293 Abnormal posture: Secondary | ICD-10-CM | POA: Diagnosis not present

## 2015-11-03 DIAGNOSIS — Z9889 Other specified postprocedural states: Secondary | ICD-10-CM | POA: Diagnosis not present

## 2015-11-03 DIAGNOSIS — R6889 Other general symptoms and signs: Secondary | ICD-10-CM | POA: Diagnosis not present

## 2015-11-03 DIAGNOSIS — M6281 Muscle weakness (generalized): Secondary | ICD-10-CM | POA: Diagnosis not present

## 2015-11-03 DIAGNOSIS — M25511 Pain in right shoulder: Secondary | ICD-10-CM | POA: Diagnosis not present

## 2015-11-03 DIAGNOSIS — M25611 Stiffness of right shoulder, not elsewhere classified: Secondary | ICD-10-CM | POA: Diagnosis not present

## 2015-11-05 DIAGNOSIS — G8929 Other chronic pain: Secondary | ICD-10-CM | POA: Diagnosis not present

## 2015-11-05 DIAGNOSIS — R293 Abnormal posture: Secondary | ICD-10-CM | POA: Diagnosis not present

## 2015-11-05 DIAGNOSIS — M25511 Pain in right shoulder: Secondary | ICD-10-CM | POA: Diagnosis not present

## 2015-11-05 DIAGNOSIS — M25611 Stiffness of right shoulder, not elsewhere classified: Secondary | ICD-10-CM | POA: Diagnosis not present

## 2015-11-05 DIAGNOSIS — M6281 Muscle weakness (generalized): Secondary | ICD-10-CM | POA: Diagnosis not present

## 2015-11-05 DIAGNOSIS — R6889 Other general symptoms and signs: Secondary | ICD-10-CM | POA: Diagnosis not present

## 2015-11-05 DIAGNOSIS — Z9889 Other specified postprocedural states: Secondary | ICD-10-CM | POA: Diagnosis not present

## 2015-11-10 DIAGNOSIS — Z9889 Other specified postprocedural states: Secondary | ICD-10-CM | POA: Diagnosis not present

## 2015-11-10 DIAGNOSIS — M6281 Muscle weakness (generalized): Secondary | ICD-10-CM | POA: Diagnosis not present

## 2015-11-10 DIAGNOSIS — R293 Abnormal posture: Secondary | ICD-10-CM | POA: Diagnosis not present

## 2015-11-10 DIAGNOSIS — R6889 Other general symptoms and signs: Secondary | ICD-10-CM | POA: Diagnosis not present

## 2015-11-10 DIAGNOSIS — M25511 Pain in right shoulder: Secondary | ICD-10-CM | POA: Diagnosis not present

## 2015-11-10 DIAGNOSIS — M25611 Stiffness of right shoulder, not elsewhere classified: Secondary | ICD-10-CM | POA: Diagnosis not present

## 2015-11-12 DIAGNOSIS — H2513 Age-related nuclear cataract, bilateral: Secondary | ICD-10-CM | POA: Diagnosis not present

## 2015-11-12 DIAGNOSIS — M25611 Stiffness of right shoulder, not elsewhere classified: Secondary | ICD-10-CM | POA: Diagnosis not present

## 2015-11-12 DIAGNOSIS — M6281 Muscle weakness (generalized): Secondary | ICD-10-CM | POA: Diagnosis not present

## 2015-11-12 DIAGNOSIS — Z9889 Other specified postprocedural states: Secondary | ICD-10-CM | POA: Diagnosis not present

## 2015-11-12 DIAGNOSIS — M25511 Pain in right shoulder: Secondary | ICD-10-CM | POA: Diagnosis not present

## 2015-11-12 DIAGNOSIS — R293 Abnormal posture: Secondary | ICD-10-CM | POA: Diagnosis not present

## 2015-11-12 DIAGNOSIS — R6889 Other general symptoms and signs: Secondary | ICD-10-CM | POA: Diagnosis not present

## 2015-11-17 DIAGNOSIS — M25611 Stiffness of right shoulder, not elsewhere classified: Secondary | ICD-10-CM | POA: Diagnosis not present

## 2015-11-17 DIAGNOSIS — R293 Abnormal posture: Secondary | ICD-10-CM | POA: Diagnosis not present

## 2015-11-17 DIAGNOSIS — R6889 Other general symptoms and signs: Secondary | ICD-10-CM | POA: Diagnosis not present

## 2015-11-17 DIAGNOSIS — M25511 Pain in right shoulder: Secondary | ICD-10-CM | POA: Diagnosis not present

## 2015-11-17 DIAGNOSIS — M6281 Muscle weakness (generalized): Secondary | ICD-10-CM | POA: Diagnosis not present

## 2015-11-17 DIAGNOSIS — Z9889 Other specified postprocedural states: Secondary | ICD-10-CM | POA: Diagnosis not present

## 2015-11-19 DIAGNOSIS — R293 Abnormal posture: Secondary | ICD-10-CM | POA: Diagnosis not present

## 2015-11-19 DIAGNOSIS — M6281 Muscle weakness (generalized): Secondary | ICD-10-CM | POA: Diagnosis not present

## 2015-11-19 DIAGNOSIS — Z7901 Long term (current) use of anticoagulants: Secondary | ICD-10-CM | POA: Diagnosis not present

## 2015-11-19 DIAGNOSIS — M25611 Stiffness of right shoulder, not elsewhere classified: Secondary | ICD-10-CM | POA: Diagnosis not present

## 2015-11-19 DIAGNOSIS — Z9889 Other specified postprocedural states: Secondary | ICD-10-CM | POA: Diagnosis not present

## 2015-11-19 DIAGNOSIS — R6889 Other general symptoms and signs: Secondary | ICD-10-CM | POA: Diagnosis not present

## 2015-11-19 DIAGNOSIS — J329 Chronic sinusitis, unspecified: Secondary | ICD-10-CM | POA: Diagnosis not present

## 2015-11-19 DIAGNOSIS — M25511 Pain in right shoulder: Secondary | ICD-10-CM | POA: Diagnosis not present

## 2015-12-28 DIAGNOSIS — R310 Gross hematuria: Secondary | ICD-10-CM | POA: Diagnosis not present

## 2015-12-28 DIAGNOSIS — R319 Hematuria, unspecified: Secondary | ICD-10-CM | POA: Diagnosis not present

## 2015-12-28 DIAGNOSIS — B9689 Other specified bacterial agents as the cause of diseases classified elsewhere: Secondary | ICD-10-CM | POA: Diagnosis not present

## 2015-12-28 DIAGNOSIS — N390 Urinary tract infection, site not specified: Secondary | ICD-10-CM | POA: Diagnosis not present

## 2015-12-28 DIAGNOSIS — Z7901 Long term (current) use of anticoagulants: Secondary | ICD-10-CM | POA: Diagnosis not present

## 2016-01-14 DIAGNOSIS — Z87891 Personal history of nicotine dependence: Secondary | ICD-10-CM | POA: Diagnosis not present

## 2016-01-14 DIAGNOSIS — S8002XA Contusion of left knee, initial encounter: Secondary | ICD-10-CM | POA: Diagnosis not present

## 2016-01-14 DIAGNOSIS — M25562 Pain in left knee: Secondary | ICD-10-CM | POA: Diagnosis not present

## 2016-01-14 DIAGNOSIS — W1789XA Other fall from one level to another, initial encounter: Secondary | ICD-10-CM | POA: Diagnosis not present

## 2016-01-14 DIAGNOSIS — M25462 Effusion, left knee: Secondary | ICD-10-CM | POA: Diagnosis not present

## 2016-01-14 DIAGNOSIS — M2392 Unspecified internal derangement of left knee: Secondary | ICD-10-CM | POA: Diagnosis not present

## 2016-01-14 DIAGNOSIS — M79662 Pain in left lower leg: Secondary | ICD-10-CM | POA: Diagnosis not present

## 2016-01-19 DIAGNOSIS — M25562 Pain in left knee: Secondary | ICD-10-CM | POA: Diagnosis not present

## 2016-01-19 DIAGNOSIS — M1712 Unilateral primary osteoarthritis, left knee: Secondary | ICD-10-CM | POA: Diagnosis not present

## 2016-01-25 DIAGNOSIS — C679 Malignant neoplasm of bladder, unspecified: Secondary | ICD-10-CM | POA: Diagnosis not present

## 2016-01-25 DIAGNOSIS — Z7901 Long term (current) use of anticoagulants: Secondary | ICD-10-CM | POA: Diagnosis not present

## 2016-01-26 DIAGNOSIS — Z7901 Long term (current) use of anticoagulants: Secondary | ICD-10-CM | POA: Diagnosis not present

## 2016-01-26 DIAGNOSIS — K219 Gastro-esophageal reflux disease without esophagitis: Secondary | ICD-10-CM | POA: Diagnosis not present

## 2016-01-26 DIAGNOSIS — M7989 Other specified soft tissue disorders: Secondary | ICD-10-CM | POA: Diagnosis not present

## 2016-01-26 DIAGNOSIS — I10 Essential (primary) hypertension: Secondary | ICD-10-CM | POA: Diagnosis not present

## 2016-01-26 DIAGNOSIS — M79605 Pain in left leg: Secondary | ICD-10-CM | POA: Diagnosis not present

## 2016-01-26 DIAGNOSIS — D6862 Lupus anticoagulant syndrome: Secondary | ICD-10-CM | POA: Diagnosis not present

## 2016-01-28 DIAGNOSIS — C679 Malignant neoplasm of bladder, unspecified: Secondary | ICD-10-CM | POA: Diagnosis not present

## 2016-01-28 DIAGNOSIS — N323 Diverticulum of bladder: Secondary | ICD-10-CM | POA: Diagnosis not present

## 2016-01-28 DIAGNOSIS — K5730 Diverticulosis of large intestine without perforation or abscess without bleeding: Secondary | ICD-10-CM | POA: Diagnosis not present

## 2016-02-01 DIAGNOSIS — I10 Essential (primary) hypertension: Secondary | ICD-10-CM | POA: Diagnosis not present

## 2016-02-01 DIAGNOSIS — Z7901 Long term (current) use of anticoagulants: Secondary | ICD-10-CM | POA: Diagnosis not present

## 2016-02-01 DIAGNOSIS — Z01810 Encounter for preprocedural cardiovascular examination: Secondary | ICD-10-CM | POA: Diagnosis not present

## 2016-02-01 DIAGNOSIS — I480 Paroxysmal atrial fibrillation: Secondary | ICD-10-CM | POA: Diagnosis not present

## 2016-02-01 DIAGNOSIS — E785 Hyperlipidemia, unspecified: Secondary | ICD-10-CM | POA: Diagnosis not present

## 2016-02-01 DIAGNOSIS — R9431 Abnormal electrocardiogram [ECG] [EKG]: Secondary | ICD-10-CM | POA: Diagnosis not present

## 2016-02-04 DIAGNOSIS — R9431 Abnormal electrocardiogram [ECG] [EKG]: Secondary | ICD-10-CM | POA: Diagnosis not present

## 2016-02-08 DIAGNOSIS — Z7901 Long term (current) use of anticoagulants: Secondary | ICD-10-CM | POA: Diagnosis not present

## 2016-02-08 DIAGNOSIS — R310 Gross hematuria: Secondary | ICD-10-CM | POA: Diagnosis not present

## 2016-02-09 DIAGNOSIS — M25562 Pain in left knee: Secondary | ICD-10-CM | POA: Diagnosis not present

## 2016-02-09 DIAGNOSIS — M1712 Unilateral primary osteoarthritis, left knee: Secondary | ICD-10-CM | POA: Diagnosis not present

## 2016-02-18 DIAGNOSIS — Z6832 Body mass index (BMI) 32.0-32.9, adult: Secondary | ICD-10-CM | POA: Diagnosis not present

## 2016-02-18 DIAGNOSIS — M329 Systemic lupus erythematosus, unspecified: Secondary | ICD-10-CM | POA: Diagnosis present

## 2016-02-18 DIAGNOSIS — M1712 Unilateral primary osteoarthritis, left knee: Secondary | ICD-10-CM | POA: Diagnosis not present

## 2016-02-18 DIAGNOSIS — E669 Obesity, unspecified: Secondary | ICD-10-CM | POA: Diagnosis present

## 2016-02-18 DIAGNOSIS — K219 Gastro-esophageal reflux disease without esophagitis: Secondary | ICD-10-CM | POA: Diagnosis present

## 2016-02-18 DIAGNOSIS — Z7901 Long term (current) use of anticoagulants: Secondary | ICD-10-CM | POA: Diagnosis not present

## 2016-02-18 DIAGNOSIS — I10 Essential (primary) hypertension: Secondary | ICD-10-CM | POA: Diagnosis not present

## 2016-02-18 DIAGNOSIS — Z471 Aftercare following joint replacement surgery: Secondary | ICD-10-CM | POA: Diagnosis not present

## 2016-02-18 DIAGNOSIS — Z87891 Personal history of nicotine dependence: Secondary | ICD-10-CM | POA: Diagnosis not present

## 2016-02-18 DIAGNOSIS — M179 Osteoarthritis of knee, unspecified: Secondary | ICD-10-CM | POA: Diagnosis not present

## 2016-02-18 DIAGNOSIS — Z86718 Personal history of other venous thrombosis and embolism: Secondary | ICD-10-CM | POA: Diagnosis not present

## 2016-02-18 DIAGNOSIS — Z96652 Presence of left artificial knee joint: Secondary | ICD-10-CM | POA: Diagnosis not present

## 2016-02-18 DIAGNOSIS — Z8546 Personal history of malignant neoplasm of prostate: Secondary | ICD-10-CM | POA: Diagnosis not present

## 2016-02-18 DIAGNOSIS — N400 Enlarged prostate without lower urinary tract symptoms: Secondary | ICD-10-CM | POA: Diagnosis present

## 2016-02-18 DIAGNOSIS — G8918 Other acute postprocedural pain: Secondary | ICD-10-CM | POA: Diagnosis not present

## 2016-02-18 DIAGNOSIS — Z8551 Personal history of malignant neoplasm of bladder: Secondary | ICD-10-CM | POA: Diagnosis not present

## 2016-02-18 DIAGNOSIS — M24662 Ankylosis, left knee: Secondary | ICD-10-CM | POA: Diagnosis not present

## 2016-02-18 DIAGNOSIS — Z85828 Personal history of other malignant neoplasm of skin: Secondary | ICD-10-CM | POA: Diagnosis not present

## 2016-02-21 DIAGNOSIS — D6862 Lupus anticoagulant syndrome: Secondary | ICD-10-CM | POA: Diagnosis not present

## 2016-02-21 DIAGNOSIS — Z471 Aftercare following joint replacement surgery: Secondary | ICD-10-CM | POA: Diagnosis not present

## 2016-02-21 DIAGNOSIS — R2689 Other abnormalities of gait and mobility: Secondary | ICD-10-CM | POA: Diagnosis not present

## 2016-02-21 DIAGNOSIS — M1712 Unilateral primary osteoarthritis, left knee: Secondary | ICD-10-CM | POA: Diagnosis not present

## 2016-02-21 DIAGNOSIS — Z4802 Encounter for removal of sutures: Secondary | ICD-10-CM | POA: Diagnosis not present

## 2016-02-21 DIAGNOSIS — Z96652 Presence of left artificial knee joint: Secondary | ICD-10-CM | POA: Diagnosis not present

## 2016-02-21 DIAGNOSIS — Z5181 Encounter for therapeutic drug level monitoring: Secondary | ICD-10-CM | POA: Diagnosis not present

## 2016-02-21 DIAGNOSIS — Z7901 Long term (current) use of anticoagulants: Secondary | ICD-10-CM | POA: Diagnosis not present

## 2016-02-21 DIAGNOSIS — M6281 Muscle weakness (generalized): Secondary | ICD-10-CM | POA: Diagnosis not present

## 2016-02-21 DIAGNOSIS — M159 Polyosteoarthritis, unspecified: Secondary | ICD-10-CM | POA: Diagnosis not present

## 2016-02-22 DIAGNOSIS — M159 Polyosteoarthritis, unspecified: Secondary | ICD-10-CM | POA: Diagnosis not present

## 2016-02-22 DIAGNOSIS — Z471 Aftercare following joint replacement surgery: Secondary | ICD-10-CM | POA: Diagnosis not present

## 2016-02-22 DIAGNOSIS — Z96652 Presence of left artificial knee joint: Secondary | ICD-10-CM | POA: Diagnosis not present

## 2016-02-22 DIAGNOSIS — R2689 Other abnormalities of gait and mobility: Secondary | ICD-10-CM | POA: Diagnosis not present

## 2016-02-22 DIAGNOSIS — M6281 Muscle weakness (generalized): Secondary | ICD-10-CM | POA: Diagnosis not present

## 2016-02-22 DIAGNOSIS — D6862 Lupus anticoagulant syndrome: Secondary | ICD-10-CM | POA: Diagnosis not present

## 2016-02-23 DIAGNOSIS — M159 Polyosteoarthritis, unspecified: Secondary | ICD-10-CM | POA: Diagnosis not present

## 2016-02-23 DIAGNOSIS — M6281 Muscle weakness (generalized): Secondary | ICD-10-CM | POA: Diagnosis not present

## 2016-02-23 DIAGNOSIS — Z96652 Presence of left artificial knee joint: Secondary | ICD-10-CM | POA: Diagnosis not present

## 2016-02-23 DIAGNOSIS — D6862 Lupus anticoagulant syndrome: Secondary | ICD-10-CM | POA: Diagnosis not present

## 2016-02-23 DIAGNOSIS — Z471 Aftercare following joint replacement surgery: Secondary | ICD-10-CM | POA: Diagnosis not present

## 2016-02-23 DIAGNOSIS — R2689 Other abnormalities of gait and mobility: Secondary | ICD-10-CM | POA: Diagnosis not present

## 2016-02-25 DIAGNOSIS — R2689 Other abnormalities of gait and mobility: Secondary | ICD-10-CM | POA: Diagnosis not present

## 2016-02-25 DIAGNOSIS — Z471 Aftercare following joint replacement surgery: Secondary | ICD-10-CM | POA: Diagnosis not present

## 2016-02-25 DIAGNOSIS — M6281 Muscle weakness (generalized): Secondary | ICD-10-CM | POA: Diagnosis not present

## 2016-02-25 DIAGNOSIS — Z96652 Presence of left artificial knee joint: Secondary | ICD-10-CM | POA: Diagnosis not present

## 2016-02-25 DIAGNOSIS — M159 Polyosteoarthritis, unspecified: Secondary | ICD-10-CM | POA: Diagnosis not present

## 2016-02-25 DIAGNOSIS — D6862 Lupus anticoagulant syndrome: Secondary | ICD-10-CM | POA: Diagnosis not present

## 2016-02-28 DIAGNOSIS — Z96652 Presence of left artificial knee joint: Secondary | ICD-10-CM | POA: Diagnosis not present

## 2016-02-28 DIAGNOSIS — R2689 Other abnormalities of gait and mobility: Secondary | ICD-10-CM | POA: Diagnosis not present

## 2016-02-28 DIAGNOSIS — D6862 Lupus anticoagulant syndrome: Secondary | ICD-10-CM | POA: Diagnosis not present

## 2016-02-28 DIAGNOSIS — Z471 Aftercare following joint replacement surgery: Secondary | ICD-10-CM | POA: Diagnosis not present

## 2016-02-28 DIAGNOSIS — M6281 Muscle weakness (generalized): Secondary | ICD-10-CM | POA: Diagnosis not present

## 2016-02-28 DIAGNOSIS — M159 Polyosteoarthritis, unspecified: Secondary | ICD-10-CM | POA: Diagnosis not present

## 2016-02-29 DIAGNOSIS — R2689 Other abnormalities of gait and mobility: Secondary | ICD-10-CM | POA: Diagnosis not present

## 2016-02-29 DIAGNOSIS — M6281 Muscle weakness (generalized): Secondary | ICD-10-CM | POA: Diagnosis not present

## 2016-02-29 DIAGNOSIS — M159 Polyosteoarthritis, unspecified: Secondary | ICD-10-CM | POA: Diagnosis not present

## 2016-02-29 DIAGNOSIS — Z471 Aftercare following joint replacement surgery: Secondary | ICD-10-CM | POA: Diagnosis not present

## 2016-02-29 DIAGNOSIS — Z96652 Presence of left artificial knee joint: Secondary | ICD-10-CM | POA: Diagnosis not present

## 2016-02-29 DIAGNOSIS — D6862 Lupus anticoagulant syndrome: Secondary | ICD-10-CM | POA: Diagnosis not present

## 2016-03-02 DIAGNOSIS — R2689 Other abnormalities of gait and mobility: Secondary | ICD-10-CM | POA: Diagnosis not present

## 2016-03-02 DIAGNOSIS — Z471 Aftercare following joint replacement surgery: Secondary | ICD-10-CM | POA: Diagnosis not present

## 2016-03-02 DIAGNOSIS — M6281 Muscle weakness (generalized): Secondary | ICD-10-CM | POA: Diagnosis not present

## 2016-03-02 DIAGNOSIS — Z96652 Presence of left artificial knee joint: Secondary | ICD-10-CM | POA: Diagnosis not present

## 2016-03-02 DIAGNOSIS — D6862 Lupus anticoagulant syndrome: Secondary | ICD-10-CM | POA: Diagnosis not present

## 2016-03-02 DIAGNOSIS — M159 Polyosteoarthritis, unspecified: Secondary | ICD-10-CM | POA: Diagnosis not present

## 2016-03-03 DIAGNOSIS — Z471 Aftercare following joint replacement surgery: Secondary | ICD-10-CM | POA: Diagnosis not present

## 2016-03-03 DIAGNOSIS — Z96652 Presence of left artificial knee joint: Secondary | ICD-10-CM | POA: Diagnosis not present

## 2016-03-03 DIAGNOSIS — M159 Polyosteoarthritis, unspecified: Secondary | ICD-10-CM | POA: Diagnosis not present

## 2016-03-03 DIAGNOSIS — D6862 Lupus anticoagulant syndrome: Secondary | ICD-10-CM | POA: Diagnosis not present

## 2016-03-03 DIAGNOSIS — R2689 Other abnormalities of gait and mobility: Secondary | ICD-10-CM | POA: Diagnosis not present

## 2016-03-03 DIAGNOSIS — M6281 Muscle weakness (generalized): Secondary | ICD-10-CM | POA: Diagnosis not present

## 2016-03-04 DIAGNOSIS — M159 Polyosteoarthritis, unspecified: Secondary | ICD-10-CM | POA: Diagnosis not present

## 2016-03-04 DIAGNOSIS — Z96652 Presence of left artificial knee joint: Secondary | ICD-10-CM | POA: Diagnosis not present

## 2016-03-04 DIAGNOSIS — R2689 Other abnormalities of gait and mobility: Secondary | ICD-10-CM | POA: Diagnosis not present

## 2016-03-04 DIAGNOSIS — M6281 Muscle weakness (generalized): Secondary | ICD-10-CM | POA: Diagnosis not present

## 2016-03-04 DIAGNOSIS — Z471 Aftercare following joint replacement surgery: Secondary | ICD-10-CM | POA: Diagnosis not present

## 2016-03-04 DIAGNOSIS — D6862 Lupus anticoagulant syndrome: Secondary | ICD-10-CM | POA: Diagnosis not present

## 2016-03-07 DIAGNOSIS — R2689 Other abnormalities of gait and mobility: Secondary | ICD-10-CM | POA: Diagnosis not present

## 2016-03-07 DIAGNOSIS — M6281 Muscle weakness (generalized): Secondary | ICD-10-CM | POA: Diagnosis not present

## 2016-03-07 DIAGNOSIS — M159 Polyosteoarthritis, unspecified: Secondary | ICD-10-CM | POA: Diagnosis not present

## 2016-03-07 DIAGNOSIS — Z471 Aftercare following joint replacement surgery: Secondary | ICD-10-CM | POA: Diagnosis not present

## 2016-03-07 DIAGNOSIS — D6862 Lupus anticoagulant syndrome: Secondary | ICD-10-CM | POA: Diagnosis not present

## 2016-03-07 DIAGNOSIS — Z96652 Presence of left artificial knee joint: Secondary | ICD-10-CM | POA: Diagnosis not present

## 2016-03-08 DIAGNOSIS — Z471 Aftercare following joint replacement surgery: Secondary | ICD-10-CM | POA: Diagnosis not present

## 2016-03-08 DIAGNOSIS — M159 Polyosteoarthritis, unspecified: Secondary | ICD-10-CM | POA: Diagnosis not present

## 2016-03-08 DIAGNOSIS — M6281 Muscle weakness (generalized): Secondary | ICD-10-CM | POA: Diagnosis not present

## 2016-03-08 DIAGNOSIS — Z96652 Presence of left artificial knee joint: Secondary | ICD-10-CM | POA: Diagnosis not present

## 2016-03-08 DIAGNOSIS — D6862 Lupus anticoagulant syndrome: Secondary | ICD-10-CM | POA: Diagnosis not present

## 2016-03-08 DIAGNOSIS — R2689 Other abnormalities of gait and mobility: Secondary | ICD-10-CM | POA: Diagnosis not present

## 2016-03-10 DIAGNOSIS — M6281 Muscle weakness (generalized): Secondary | ICD-10-CM | POA: Diagnosis not present

## 2016-03-10 DIAGNOSIS — M159 Polyosteoarthritis, unspecified: Secondary | ICD-10-CM | POA: Diagnosis not present

## 2016-03-10 DIAGNOSIS — D6862 Lupus anticoagulant syndrome: Secondary | ICD-10-CM | POA: Diagnosis not present

## 2016-03-10 DIAGNOSIS — Z96652 Presence of left artificial knee joint: Secondary | ICD-10-CM | POA: Diagnosis not present

## 2016-03-10 DIAGNOSIS — R2689 Other abnormalities of gait and mobility: Secondary | ICD-10-CM | POA: Diagnosis not present

## 2016-03-10 DIAGNOSIS — Z471 Aftercare following joint replacement surgery: Secondary | ICD-10-CM | POA: Diagnosis not present

## 2016-03-14 DIAGNOSIS — D6862 Lupus anticoagulant syndrome: Secondary | ICD-10-CM | POA: Diagnosis not present

## 2016-03-14 DIAGNOSIS — R2689 Other abnormalities of gait and mobility: Secondary | ICD-10-CM | POA: Diagnosis not present

## 2016-03-14 DIAGNOSIS — Z471 Aftercare following joint replacement surgery: Secondary | ICD-10-CM | POA: Diagnosis not present

## 2016-03-14 DIAGNOSIS — M6281 Muscle weakness (generalized): Secondary | ICD-10-CM | POA: Diagnosis not present

## 2016-03-14 DIAGNOSIS — M159 Polyosteoarthritis, unspecified: Secondary | ICD-10-CM | POA: Diagnosis not present

## 2016-03-14 DIAGNOSIS — Z96652 Presence of left artificial knee joint: Secondary | ICD-10-CM | POA: Diagnosis not present

## 2016-03-16 DIAGNOSIS — Z96652 Presence of left artificial knee joint: Secondary | ICD-10-CM | POA: Diagnosis not present

## 2016-03-16 DIAGNOSIS — M1712 Unilateral primary osteoarthritis, left knee: Secondary | ICD-10-CM | POA: Diagnosis not present

## 2016-03-16 DIAGNOSIS — Z7901 Long term (current) use of anticoagulants: Secondary | ICD-10-CM | POA: Diagnosis not present

## 2016-03-16 DIAGNOSIS — M25562 Pain in left knee: Secondary | ICD-10-CM | POA: Diagnosis not present

## 2016-04-06 DIAGNOSIS — K219 Gastro-esophageal reflux disease without esophagitis: Secondary | ICD-10-CM | POA: Diagnosis not present

## 2016-04-06 DIAGNOSIS — E669 Obesity, unspecified: Secondary | ICD-10-CM | POA: Diagnosis not present

## 2016-04-06 DIAGNOSIS — D6862 Lupus anticoagulant syndrome: Secondary | ICD-10-CM | POA: Diagnosis not present

## 2016-04-06 DIAGNOSIS — I10 Essential (primary) hypertension: Secondary | ICD-10-CM | POA: Diagnosis not present

## 2016-04-25 DIAGNOSIS — R310 Gross hematuria: Secondary | ICD-10-CM | POA: Diagnosis not present

## 2016-04-25 DIAGNOSIS — D6862 Lupus anticoagulant syndrome: Secondary | ICD-10-CM | POA: Diagnosis not present

## 2016-05-04 DIAGNOSIS — M5430 Sciatica, unspecified side: Secondary | ICD-10-CM | POA: Diagnosis not present

## 2016-05-04 DIAGNOSIS — J029 Acute pharyngitis, unspecified: Secondary | ICD-10-CM | POA: Diagnosis not present

## 2016-05-10 DIAGNOSIS — R310 Gross hematuria: Secondary | ICD-10-CM | POA: Diagnosis not present

## 2016-05-10 DIAGNOSIS — Z8546 Personal history of malignant neoplasm of prostate: Secondary | ICD-10-CM | POA: Diagnosis not present

## 2016-05-25 DIAGNOSIS — Z7901 Long term (current) use of anticoagulants: Secondary | ICD-10-CM | POA: Diagnosis not present

## 2016-06-03 DIAGNOSIS — D6862 Lupus anticoagulant syndrome: Secondary | ICD-10-CM | POA: Diagnosis not present

## 2016-07-26 DIAGNOSIS — E782 Mixed hyperlipidemia: Secondary | ICD-10-CM | POA: Diagnosis not present

## 2016-07-26 DIAGNOSIS — I8291 Chronic embolism and thrombosis of unspecified vein: Secondary | ICD-10-CM | POA: Diagnosis not present

## 2016-07-26 DIAGNOSIS — C61 Malignant neoplasm of prostate: Secondary | ICD-10-CM | POA: Diagnosis not present

## 2016-07-26 DIAGNOSIS — D6862 Lupus anticoagulant syndrome: Secondary | ICD-10-CM | POA: Diagnosis not present

## 2016-08-05 ENCOUNTER — Other Ambulatory Visit: Payer: Self-pay

## 2016-08-11 DIAGNOSIS — C61 Malignant neoplasm of prostate: Secondary | ICD-10-CM | POA: Diagnosis not present

## 2016-08-11 DIAGNOSIS — R6882 Decreased libido: Secondary | ICD-10-CM | POA: Diagnosis not present

## 2016-08-11 DIAGNOSIS — N401 Enlarged prostate with lower urinary tract symptoms: Secondary | ICD-10-CM | POA: Diagnosis not present

## 2016-08-11 DIAGNOSIS — Z8551 Personal history of malignant neoplasm of bladder: Secondary | ICD-10-CM | POA: Diagnosis not present

## 2016-08-11 DIAGNOSIS — R3912 Poor urinary stream: Secondary | ICD-10-CM | POA: Diagnosis not present

## 2016-08-17 DIAGNOSIS — L918 Other hypertrophic disorders of the skin: Secondary | ICD-10-CM | POA: Diagnosis not present

## 2016-08-17 DIAGNOSIS — D2271 Melanocytic nevi of right lower limb, including hip: Secondary | ICD-10-CM | POA: Diagnosis not present

## 2016-08-17 DIAGNOSIS — Z85828 Personal history of other malignant neoplasm of skin: Secondary | ICD-10-CM | POA: Diagnosis not present

## 2016-08-17 DIAGNOSIS — D2261 Melanocytic nevi of right upper limb, including shoulder: Secondary | ICD-10-CM | POA: Diagnosis not present

## 2016-08-17 DIAGNOSIS — L821 Other seborrheic keratosis: Secondary | ICD-10-CM | POA: Diagnosis not present

## 2016-08-17 DIAGNOSIS — D1801 Hemangioma of skin and subcutaneous tissue: Secondary | ICD-10-CM | POA: Diagnosis not present

## 2016-08-17 DIAGNOSIS — D2272 Melanocytic nevi of left lower limb, including hip: Secondary | ICD-10-CM | POA: Diagnosis not present

## 2016-08-17 DIAGNOSIS — B078 Other viral warts: Secondary | ICD-10-CM | POA: Diagnosis not present

## 2016-08-19 DIAGNOSIS — J209 Acute bronchitis, unspecified: Secondary | ICD-10-CM | POA: Diagnosis not present

## 2016-08-19 DIAGNOSIS — J40 Bronchitis, not specified as acute or chronic: Secondary | ICD-10-CM | POA: Diagnosis not present

## 2016-09-08 DIAGNOSIS — E782 Mixed hyperlipidemia: Secondary | ICD-10-CM | POA: Diagnosis not present

## 2016-09-08 DIAGNOSIS — C61 Malignant neoplasm of prostate: Secondary | ICD-10-CM | POA: Diagnosis not present

## 2016-09-08 DIAGNOSIS — D6862 Lupus anticoagulant syndrome: Secondary | ICD-10-CM | POA: Diagnosis not present

## 2016-09-08 DIAGNOSIS — I8291 Chronic embolism and thrombosis of unspecified vein: Secondary | ICD-10-CM | POA: Diagnosis not present

## 2016-09-14 DIAGNOSIS — Z7901 Long term (current) use of anticoagulants: Secondary | ICD-10-CM | POA: Diagnosis not present

## 2016-09-14 DIAGNOSIS — Z86718 Personal history of other venous thrombosis and embolism: Secondary | ICD-10-CM | POA: Diagnosis not present

## 2016-10-04 DIAGNOSIS — K219 Gastro-esophageal reflux disease without esophagitis: Secondary | ICD-10-CM | POA: Diagnosis not present

## 2016-10-04 DIAGNOSIS — D6862 Lupus anticoagulant syndrome: Secondary | ICD-10-CM | POA: Diagnosis not present

## 2016-10-04 DIAGNOSIS — Z7901 Long term (current) use of anticoagulants: Secondary | ICD-10-CM | POA: Diagnosis not present

## 2016-10-04 DIAGNOSIS — I10 Essential (primary) hypertension: Secondary | ICD-10-CM | POA: Diagnosis not present

## 2016-10-04 DIAGNOSIS — Z86718 Personal history of other venous thrombosis and embolism: Secondary | ICD-10-CM | POA: Diagnosis not present

## 2016-10-31 DIAGNOSIS — Z7901 Long term (current) use of anticoagulants: Secondary | ICD-10-CM | POA: Diagnosis not present

## 2016-10-31 DIAGNOSIS — Z86718 Personal history of other venous thrombosis and embolism: Secondary | ICD-10-CM | POA: Diagnosis not present

## 2016-11-14 DIAGNOSIS — Z8546 Personal history of malignant neoplasm of prostate: Secondary | ICD-10-CM | POA: Diagnosis not present

## 2016-11-29 DIAGNOSIS — Z86718 Personal history of other venous thrombosis and embolism: Secondary | ICD-10-CM | POA: Diagnosis not present

## 2016-11-29 DIAGNOSIS — Z7901 Long term (current) use of anticoagulants: Secondary | ICD-10-CM | POA: Diagnosis not present

## 2016-12-26 DIAGNOSIS — Z86718 Personal history of other venous thrombosis and embolism: Secondary | ICD-10-CM | POA: Diagnosis not present

## 2016-12-26 DIAGNOSIS — Z7901 Long term (current) use of anticoagulants: Secondary | ICD-10-CM | POA: Diagnosis not present

## 2016-12-28 DIAGNOSIS — I8291 Chronic embolism and thrombosis of unspecified vein: Secondary | ICD-10-CM | POA: Diagnosis not present

## 2017-01-12 DIAGNOSIS — R3915 Urgency of urination: Secondary | ICD-10-CM | POA: Diagnosis not present

## 2017-01-12 DIAGNOSIS — C679 Malignant neoplasm of bladder, unspecified: Secondary | ICD-10-CM | POA: Diagnosis not present

## 2017-01-12 DIAGNOSIS — R310 Gross hematuria: Secondary | ICD-10-CM | POA: Diagnosis not present

## 2017-01-12 DIAGNOSIS — C61 Malignant neoplasm of prostate: Secondary | ICD-10-CM | POA: Diagnosis not present

## 2017-01-12 DIAGNOSIS — A499 Bacterial infection, unspecified: Secondary | ICD-10-CM | POA: Diagnosis not present

## 2017-01-12 DIAGNOSIS — N390 Urinary tract infection, site not specified: Secondary | ICD-10-CM | POA: Diagnosis not present

## 2017-01-12 DIAGNOSIS — Z113 Encounter for screening for infections with a predominantly sexual mode of transmission: Secondary | ICD-10-CM | POA: Diagnosis not present

## 2017-01-13 DIAGNOSIS — K5730 Diverticulosis of large intestine without perforation or abscess without bleeding: Secondary | ICD-10-CM | POA: Diagnosis not present

## 2017-01-17 DIAGNOSIS — Z8551 Personal history of malignant neoplasm of bladder: Secondary | ICD-10-CM | POA: Diagnosis not present

## 2017-01-17 DIAGNOSIS — R310 Gross hematuria: Secondary | ICD-10-CM | POA: Diagnosis not present

## 2017-01-17 DIAGNOSIS — E291 Testicular hypofunction: Secondary | ICD-10-CM | POA: Diagnosis not present

## 2017-01-25 DIAGNOSIS — Z7901 Long term (current) use of anticoagulants: Secondary | ICD-10-CM | POA: Diagnosis not present

## 2017-01-25 DIAGNOSIS — Z86718 Personal history of other venous thrombosis and embolism: Secondary | ICD-10-CM | POA: Diagnosis not present

## 2017-02-21 DIAGNOSIS — Z7901 Long term (current) use of anticoagulants: Secondary | ICD-10-CM | POA: Diagnosis not present

## 2017-02-21 DIAGNOSIS — Z86718 Personal history of other venous thrombosis and embolism: Secondary | ICD-10-CM | POA: Diagnosis not present

## 2017-03-20 DIAGNOSIS — Z86718 Personal history of other venous thrombosis and embolism: Secondary | ICD-10-CM | POA: Diagnosis not present

## 2017-03-20 DIAGNOSIS — Z7901 Long term (current) use of anticoagulants: Secondary | ICD-10-CM | POA: Diagnosis not present

## 2017-04-19 DIAGNOSIS — Z86718 Personal history of other venous thrombosis and embolism: Secondary | ICD-10-CM | POA: Diagnosis not present

## 2017-04-19 DIAGNOSIS — Z7901 Long term (current) use of anticoagulants: Secondary | ICD-10-CM | POA: Diagnosis not present

## 2017-05-02 DIAGNOSIS — Z0000 Encounter for general adult medical examination without abnormal findings: Secondary | ICD-10-CM | POA: Diagnosis not present

## 2017-05-02 DIAGNOSIS — Z23 Encounter for immunization: Secondary | ICD-10-CM | POA: Diagnosis not present

## 2017-05-02 DIAGNOSIS — Z7689 Persons encountering health services in other specified circumstances: Secondary | ICD-10-CM | POA: Diagnosis not present

## 2017-05-04 DIAGNOSIS — C61 Malignant neoplasm of prostate: Secondary | ICD-10-CM | POA: Diagnosis not present

## 2017-05-04 DIAGNOSIS — M1990 Unspecified osteoarthritis, unspecified site: Secondary | ICD-10-CM | POA: Diagnosis not present

## 2017-05-04 DIAGNOSIS — E291 Testicular hypofunction: Secondary | ICD-10-CM | POA: Diagnosis not present

## 2017-05-04 DIAGNOSIS — D6862 Lupus anticoagulant syndrome: Secondary | ICD-10-CM | POA: Diagnosis not present

## 2017-05-04 DIAGNOSIS — R6889 Other general symptoms and signs: Secondary | ICD-10-CM | POA: Diagnosis not present

## 2017-05-04 DIAGNOSIS — E782 Mixed hyperlipidemia: Secondary | ICD-10-CM | POA: Diagnosis not present

## 2017-05-23 DIAGNOSIS — Z7901 Long term (current) use of anticoagulants: Secondary | ICD-10-CM | POA: Diagnosis not present

## 2017-05-23 DIAGNOSIS — Z86718 Personal history of other venous thrombosis and embolism: Secondary | ICD-10-CM | POA: Diagnosis not present

## 2017-06-19 DIAGNOSIS — Z86718 Personal history of other venous thrombosis and embolism: Secondary | ICD-10-CM | POA: Diagnosis not present

## 2017-06-19 DIAGNOSIS — Z7901 Long term (current) use of anticoagulants: Secondary | ICD-10-CM | POA: Diagnosis not present

## 2017-06-27 DIAGNOSIS — I8291 Chronic embolism and thrombosis of unspecified vein: Secondary | ICD-10-CM | POA: Diagnosis not present

## 2017-06-27 DIAGNOSIS — R3912 Poor urinary stream: Secondary | ICD-10-CM | POA: Diagnosis not present

## 2017-06-27 DIAGNOSIS — R3913 Splitting of urinary stream: Secondary | ICD-10-CM | POA: Diagnosis not present

## 2017-07-11 DIAGNOSIS — I8291 Chronic embolism and thrombosis of unspecified vein: Secondary | ICD-10-CM | POA: Diagnosis not present

## 2017-07-12 DIAGNOSIS — F320 Major depressive disorder, single episode, mild: Secondary | ICD-10-CM | POA: Diagnosis not present

## 2017-07-12 DIAGNOSIS — C61 Malignant neoplasm of prostate: Secondary | ICD-10-CM | POA: Diagnosis not present

## 2017-07-12 DIAGNOSIS — F900 Attention-deficit hyperactivity disorder, predominantly inattentive type: Secondary | ICD-10-CM | POA: Diagnosis not present

## 2017-07-12 DIAGNOSIS — F4322 Adjustment disorder with anxiety: Secondary | ICD-10-CM | POA: Diagnosis not present

## 2017-07-12 DIAGNOSIS — C7989 Secondary malignant neoplasm of other specified sites: Secondary | ICD-10-CM | POA: Diagnosis not present

## 2017-07-12 DIAGNOSIS — D6862 Lupus anticoagulant syndrome: Secondary | ICD-10-CM | POA: Diagnosis not present

## 2017-07-12 DIAGNOSIS — I10 Essential (primary) hypertension: Secondary | ICD-10-CM | POA: Diagnosis not present

## 2017-07-14 DIAGNOSIS — N401 Enlarged prostate with lower urinary tract symptoms: Secondary | ICD-10-CM | POA: Diagnosis not present

## 2017-07-14 DIAGNOSIS — R350 Frequency of micturition: Secondary | ICD-10-CM | POA: Diagnosis not present

## 2017-07-18 DIAGNOSIS — Z86718 Personal history of other venous thrombosis and embolism: Secondary | ICD-10-CM | POA: Diagnosis not present

## 2017-07-18 DIAGNOSIS — Z7901 Long term (current) use of anticoagulants: Secondary | ICD-10-CM | POA: Diagnosis not present

## 2017-07-20 DIAGNOSIS — I8291 Chronic embolism and thrombosis of unspecified vein: Secondary | ICD-10-CM | POA: Diagnosis not present

## 2017-07-27 DIAGNOSIS — I8291 Chronic embolism and thrombosis of unspecified vein: Secondary | ICD-10-CM | POA: Diagnosis not present

## 2017-08-01 DIAGNOSIS — C679 Malignant neoplasm of bladder, unspecified: Secondary | ICD-10-CM | POA: Diagnosis not present

## 2017-08-01 DIAGNOSIS — R8271 Bacteriuria: Secondary | ICD-10-CM | POA: Diagnosis not present

## 2017-08-01 DIAGNOSIS — N99111 Postprocedural bulbous urethral stricture: Secondary | ICD-10-CM | POA: Diagnosis not present

## 2017-08-03 ENCOUNTER — Other Ambulatory Visit: Payer: Self-pay | Admitting: Urology

## 2017-08-10 DIAGNOSIS — C61 Malignant neoplasm of prostate: Secondary | ICD-10-CM | POA: Diagnosis not present

## 2017-08-10 DIAGNOSIS — D6862 Lupus anticoagulant syndrome: Secondary | ICD-10-CM | POA: Diagnosis not present

## 2017-08-10 DIAGNOSIS — F900 Attention-deficit hyperactivity disorder, predominantly inattentive type: Secondary | ICD-10-CM | POA: Diagnosis not present

## 2017-08-10 DIAGNOSIS — F4322 Adjustment disorder with anxiety: Secondary | ICD-10-CM | POA: Diagnosis not present

## 2017-08-11 DIAGNOSIS — H524 Presbyopia: Secondary | ICD-10-CM | POA: Diagnosis not present

## 2017-08-11 DIAGNOSIS — H5213 Myopia, bilateral: Secondary | ICD-10-CM | POA: Diagnosis not present

## 2017-08-11 DIAGNOSIS — H2513 Age-related nuclear cataract, bilateral: Secondary | ICD-10-CM | POA: Diagnosis not present

## 2017-08-11 DIAGNOSIS — H52223 Regular astigmatism, bilateral: Secondary | ICD-10-CM | POA: Diagnosis not present

## 2017-08-15 DIAGNOSIS — Z86718 Personal history of other venous thrombosis and embolism: Secondary | ICD-10-CM | POA: Diagnosis not present

## 2017-08-15 DIAGNOSIS — Z7901 Long term (current) use of anticoagulants: Secondary | ICD-10-CM | POA: Diagnosis not present

## 2017-08-15 DIAGNOSIS — I8291 Chronic embolism and thrombosis of unspecified vein: Secondary | ICD-10-CM | POA: Diagnosis not present

## 2017-08-18 ENCOUNTER — Encounter (HOSPITAL_BASED_OUTPATIENT_CLINIC_OR_DEPARTMENT_OTHER): Payer: Self-pay | Admitting: *Deleted

## 2017-08-18 NOTE — Progress Notes (Signed)
To [facility] at 0600-Istat,Ekg on arrival-Npo after Mn-will take metoprolol,losartan,nexium,use qvar in am.Instructed to hold warfarin,celebrex 5 days prior by his primary physician in [place].

## 2017-08-22 DIAGNOSIS — I8291 Chronic embolism and thrombosis of unspecified vein: Secondary | ICD-10-CM | POA: Diagnosis not present

## 2017-08-24 NOTE — H&P (Signed)
HPI: Mathew Randolph is a 71 year-old male with a bladder tumor recurrence and a urethral stricture.  He generally urinates every 2 hours in the daytime. He sometimes can sit through a two-hour movie without urinating. The patients frequency is worse with fluid consumption.   He has nocturia 4-5 times per night. He reports a history of taking a diuretic.   08/01/17: He began experiencing irritative symptoms and his PVR was checked and found to be 13 cc on 07/14/17. At that time he was started on samples of Toviaz 4 mg. His last cystoscopic evaluation in 2/18 revealed BPH but no other worrisome findings.  He reports that he voids with a weak stream but continues to have urgency and nocturia 4. It was recommended he try to avoid caffeine and alcohol to see if he would note any improvement in the cell very little. Toviaz did not result in significant improvement either. He remains on tamsulosin 0.8 mg. He is concerned about the presence of bladder cancer.     ALLERGIES: Sulfa Drugs - Skin Rash    MEDICATIONS: Finasteride 5 mg tablet 1 tablet PO Daily  Metoprolol Succinate  Tamsulosin Hcl 0.4 mg capsule, ext release 24 hr 2 capsule PO Q PM  Testosterone Cypionate 200 mg/ml vial 1 ml IM Q2WK  Toviaz 4 mg tablet, extended release 24 hr 1 tablet PO Daily  3Ml Syringes W/22G 1&1/2 Inch Needle 1 syringe IM As Directed for use with testosterone injections  Amphetamine-Dextroamphet ER 25 MG Oral Capsule Extended Release 24 Hour Oral  Celebrex 200 mg capsule Oral  Flonase 50 mcg/actuation spray, suspension Nasal  Lasix  Losartan Potassium 100 mg tablet Oral  Nexium 40 mg capsule,delayed release Oral  Qvar 80 MCG/ACT Inhalation Aerosol Solution Inhalation  Sertraline HCl - 50 MG Oral Tablet Oral Sildenafil 20 mg tablet Take 2-5 tablets PRN  Toprol Xl 50 mg tablet, extended release 24 hr Oral  Warfarin Sodium 5 mg tablet Oral     GU PSH: Cysto Bladder Ureth Biopsy - 2013 Cystoscopy - 01/17/2017,  08/11/2016 Cystoscopy TURBT 2-5 cm - 2012 Insertion IPP - 2013 [place], PROS - 2015      PSH Notes: Pelvic Repair  micro dystectomy 2016    NON-GU PSH: Appendectomy - 2011 Back surgery Ins Endovas Vena Cava Filtr - 2011 Total Knee Replacement, Left    GU PMH: Splitting of urinary stream (Acute), Will have pt RTC for possible cysto w/Dr. Karsten Ro to r/o possible BNC. Will continue with Tamsulosin 0.4 mg 2 po daily. Discussed avoidance of bladder irritants. - 06/27/2017 Weak Urinary Stream (Acute), Will have pt RTC for possible cysto w/Dr. Karsten Ro to r/o possible BNC. Will continue with Tamsulosin 0.4 mg 2 po daily. Discussed avoidance of bladder irritants. - 06/27/2017, (Stable), He uses sildenafil which does help improve his urination., - 08/11/2016 Gross hematuria (Worsening), His CT scan revealed no abnormality of the upper tract and I confirmed he was not having any hematuria from the upper tract cystoscopically by visualizing clear reflux from the right and left ureteral orifice. In addition he was found to have no abnormality whatsoever of the bladder and no active bleeding. The prostatic urethra is clearly where his bleeding is coming from. - 01/17/2017 History of bladder cancer (Stable), He was noted to be free of any recurrent transitional cell carcinoma of the bladder on cystoscopy today. I will see him again in 1 year for repeat surveillance cystoscopy. - 08/11/2016 Prostate Cancer (Stable), His PSA remains low and  stable at 0.1. We discussed the fact that testosterone replacement does carry with it some slight risk but I think the risk is exceedingly low and we will monitor him closely. - 08/11/2016 History of prostate cancer (Stable) - 05/10/2016, History of malignant neoplasm of prostate, - 2016 Abnormal urine findings, Cytological and Histological exam, Abnormal urine cytology - 09/09/2015 Bladder Diverticulum, Diverticulum, bladder acquired - 09/09/2015 Postprocedural bulbous  urethral stricture, male, Postprocedural bulbous urethral stricture - 09/09/2015 BPH w/LUTS, Benign prostatic hyperplasia with urinary obstruction - 2015 Male ED, unspecified, Erectile dysfunction - 2015 Chronic prostatitis, Prostatitis, chronic - 2014 History of urolithiasis, Nephrolithiasis - 2014 Peyronies Disease, Peyronie's disease - 2014 Primary hypogonadism, Hypogonadism, testicular - 2014      PMH Notes: Adenocarcinoma of the prostate: He was noted to have an increased PSA in September 2011 which was 9.65 (7.9% free). The patient requested this be rechecked since he had ejaculated close to the time of his PSA lab and was concerned about a false positive. His repeated PSA remained elevated at 7.04 (11.5% free). He underwent a prostate biopsy on 08/30/10 which demonstrated Gleason 3+4=7 adenocarcinoma with 8 out of 12 biopsy cores found to be malignant.  Treatment: He was initially most interested in a radiation seed implantation but was not felt to be a good candidate for brachytherapy alone and therefore underwent external beam radiation therapy with radioactive seed boost on 02/11/11.   Transitional cell carcinoma of the bladder: He had a episode of gross hematuria on Coumadin. Upper tract evaluation was negative. He was found to have a solitary tumor at the dome of his bladder which was resected on 09/19/11. 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  Treatment: He underwent an induction course of full-strength BCG beginning in 11/12.  Followup bladder biopsy in 3/13 was found to be negative.  Maintenance BCG for 36 months (completed 4/16)   Organic erectile dysfunction: This has been managed with Viagra 100 mg previously and then the patient was switched to Cialis 5 mg for daily use which did seem to help but his insurance would not cover this. He also was prescribed MUSE which caused aching in the penis and therefore he was started on intracavernosal  injections with papaverine and phentolamine (30/1).  He has undergone penile prosthesis implantation in the past.  Current therapy: Sildenafil   BPH with outlet obstruction: This has been managed with Flomax as well as Cialis for daily use.   Gross hematuria: He experienced gross hematuria in 2/18. A urine culture was found to be negative and a CT scan with and without contrast was noted be normal. He had undergone his routine surveillance cystoscopy in 9/17 with no abnormality and repeat cystoscopy in 2/18 revealed no evidence of recurrent transitional cell carcinoma or other lesions within the bladder but a lot of superficial prostatic blood vessels were identified. He had reported experiencing primarily terminal hematuria.   NON-GU PMH: Decreased libido, He indicates that he has minimal libido and in the past has been on AndroGel for hypogonadism. He wanted to discuss restarting testosterone replacement because his PSA has been so low for such a long time. 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 site (Stable) - 05/10/2016, History of carcinoma in situ of bladder, - 09/09/2015 Allergy status to unspecified drugs, medicaments and biological substances status, Allergies - 2014 Personal history of other diseases of the circulatory system, History of hypertension - 2014 Personal history of other diseases of  the digestive system, History of esophageal reflux - 2014 Personal history of other mental and behavioral disorders, History of depression - 2014 Unspecified atrial fibrillation, Atrial Fibrillation - 2014    FAMILY HISTORY: No pertinent family history - Other, No Family History Prostate Cancer - No Family History   SOCIAL HISTORY: Marital Status: Married Preferred Language: English; Ethnicity: Not Hispanic Or Latino; Race: White Current Smoking Status: Patient does not smoke anymore. Smoked for 35 years.   Tobacco Use Assessment Completed: Used Tobacco in last 30 days? Does  not use smokeless tobacco. Drinks 1 drink per week. Types of alcohol consumed: Wine.  Drinks 1 caffeinated drink per day.    REVIEW OF SYSTEMS:    GU Review Male:   Patient reports frequent urination, hard to postpone urination, get up at night to urinate, and trouble starting your stream. Patient denies burning/ pain with urination, leakage of urine, stream starts and stops, have to strain to urinate , erection problems, and penile pain.  Gastrointestinal (Upper):   Patient denies nausea, vomiting, and indigestion/ heartburn.  Gastrointestinal (Lower):   Patient denies diarrhea and constipation.  Constitutional:   Patient reports night sweats. Patient denies fever, weight loss, and fatigue.  Skin:   Patient denies skin rash/ lesion and itching.  Eyes:   Patient denies blurred vision and double vision.  Ears/ Nose/ Throat:   Patient reports sinus problems. Patient denies sore throat.  Hematologic/Lymphatic:   Patient denies swollen glands and easy bruising.  Cardiovascular:   Patient reports leg swelling. Patient denies chest pains.  Respiratory:   Patient denies cough and shortness of breath.  Endocrine:   Patient denies excessive thirst.  Musculoskeletal:   Patient reports joint pain. Patient denies back pain.  Neurological:   Patient denies headaches and dizziness.  Psychologic:   Patient denies depression and anxiety.   VITAL SIGNS:    Weight 220 lb / 99.79 kg  Height 71 in / 180.34 cm  BP 133/79 mmHg  Pulse 96 /min  Temperature 98.5 F / 36.9 C  BMI 30.7 kg/m    MULTI-SYSTEM PHYSICAL EXAMINATION:    Constitutional: Well-nourished. No physical deformities. Normally developed. Good grooming.  Neck: Neck symmetrical, not swollen. Normal tracheal position.  Respiratory: No labored breathing, no use of accessory muscles.   Cardiovascular: Normal temperature, normal extremity pulses, no swelling, no varicosities.  Lymphatic: No enlargement of neck, axillae, groin.  Skin: No  paleness, no jaundice, no cyanosis. No lesion, no ulcer, no rash.  Neurologic / Psychiatric: Oriented to time, oriented to place, oriented to person. No depression, no anxiety, no agitation.  Gastrointestinal: No mass, no tenderness, no rigidity, non obese abdomen.  Eyes: Normal conjunctivae. Normal eyelids.  Ears, Nose, Mouth, and Throat: Left ear no scars, no lesions, no masses. Right ear no scars, no lesions, no masses. Nose no scars, no lesions, no masses. Normal hearing. Normal lips.  Musculoskeletal: Normal gait and station of head and neck.  Scrotum: No lesions. No edema. No cysts. No warts.  Epididymides: Right: no spermatocele, no masses, no cysts, no tenderness, no induration, no enlargement. Left: no spermatocele, no masses, no cysts, no tenderness, no induration, no enlargement.  Testes: No tenderness, no swelling, no enlargement left testes. No tenderness, no swelling, no enlargement right testes. Normal location left testes. Normal location right testes. No mass, no cyst, no varicocele, no hydrocele left testes. No mass, no cyst, no varicocele, no hydrocele right testes.  Urethral Meatus: Normal size. No lesion, no wart, no discharge,  no polyp. Normal location.  Penis: Circumcised, no warts, no cracks. No dorsal Peyronie's plaques, no left corporal Peyronie's plaques, no right corporal Peyronie's plaques, no scarring, no warts. No balanitis, no meatal stenosis. Semi-rigid prosthesis in place   PAST DATA REVIEWED:  Source Of History:  Patient  Records Review:   Previous Patient Records, POC Tool  Urodynamics Review:   Review Bladder Scan   11/14/16 11/15/13 03/26/13 05/03/12 08/27/10 08/24/10 09/08/05 08/25/04  PSA  Total PSA 0.022  0.09  0.19  0.55  7.04  9.65  2.74  2.82   Free PSA     0.81  0.76     % Free PSA     11.5  7.9       11/14/16 08/11/16 08/24/10  Hormones  Testosterone, Total 164.7 pg/dL 140.8 pg/dL 1324.0     PROCEDURES:         Cysto Uretheral Dilation done  on 08/01/17  Risks, benefits, and some of the potential complications of the procedure were discussed at length with the patient including infection, bleeding, voiding discomfort, urinary retention, fever, chills, sepsis, and others. All questions were answered. Informed consent was obtained. Sterile technique and 2% Lidocaine intraurethral analgesia were used.  Meatus:  Normal size. Normal location. Normal condition.  Urethra:  Moderate bulbous stricture. I was able to pop through this with the flexible cystoscope with not too much difficulty. This dilated the area significantly enough for me to proceed with evaluation of his bladder.   External Sphincter:  Normal.  Verumontanum:  Normal.  Prostate:  Non-obstructing. No hyperplasia.  Bladder Neck:  Non-obstructing.  Ureteral Orifices:  Normal location. Normal size. Normal shape. Effluxed clear urine.  Bladder:  Mild trabeculation. < 1/2 cm tumor. Solitary tumor. It is located within the bladder diverticulum on the right floor of his bladder Normal mucosa. No stones.      The lower urinary tract was carefully examined. The procedure was well-tolerated and without complications. Instructions were given to call the office immediately for bloody urine, difficulty urinating, urinary retention, painful or frequent urination, fever or other illness. The patient stated that he understood these instructions and would comply with them.         Urinalysis w/Scope Dipstick Dipstick Cont'd Micro  Color: Yellow Bilirubin: Neg WBC/hpf: 6 - 10/hpf  Appearance: Clear Ketones: Neg RBC/hpf: 3 - 10/hpf  Specific Gravity: 1.020 Blood: 1+ Bacteria: Few (10-25/hpf)  pH: 5.5 Protein: 3+ Cystals: NS (Not Seen)  Glucose: Neg Urobilinogen: 0.2 Casts: Hyaline    Nitrites: Neg Trichomonas: Not Present    Leukocyte Esterase: Neg Mucous: Present      Epithelial Cells: 0 - 5/hpf      Yeast: NS (Not Seen)      Sperm: Not Present    ASSESSMENT/PLAN:      ICD-10 Details   1 GU:   Bladder Cancer, Unspec - C67.9 He was found to have a new papillary tumor within his bladder diverticulum on the right floor of his bladder. We therefore discussed transurethral resectional biopsy.  2   Postprocedural bulbous urethral stricture, male - N99.111 Worsening - His voiding symptoms were secondary to the urethral stricture I found in the bulbar urethra today. I was able to dilate it and he urinated after his cystoscopic procedure and noted significant improvement in his voiding. We discussed addressing this with balloon dilatation under anesthesia for more permanent form of treatment.  3 NON-GU:   Bacteriuria - R82.71 He did have some pyuria and bacteriuria  today and because he is going to be scheduled for surgery I have cultured the urine in preparation for surgery and it was negative.             Notes:   He has a semirigid penile prosthesis in place and will need the extra long resectoscope and cystoscope.

## 2017-08-25 ENCOUNTER — Encounter (HOSPITAL_BASED_OUTPATIENT_CLINIC_OR_DEPARTMENT_OTHER)
Admission: RE | Disposition: A | Discharge status: Home / Self Care | Payer: Self-pay | Source: Ambulatory Visit | Attending: Urology

## 2017-08-25 ENCOUNTER — Encounter (HOSPITAL_BASED_OUTPATIENT_CLINIC_OR_DEPARTMENT_OTHER): Payer: Self-pay

## 2017-08-25 ENCOUNTER — Ambulatory Visit (HOSPITAL_BASED_OUTPATIENT_CLINIC_OR_DEPARTMENT_OTHER)
Admission: RE | Admit: 2017-08-25 | Discharge: 2017-08-25 | Disposition: A | Discharge status: Home / Self Care | Payer: Medicare Other | Source: Ambulatory Visit | Attending: Urology | Admitting: Urology

## 2017-08-25 ENCOUNTER — Ambulatory Visit (HOSPITAL_BASED_OUTPATIENT_CLINIC_OR_DEPARTMENT_OTHER): Payer: Medicare Other | Admitting: Anesthesiology

## 2017-08-25 DIAGNOSIS — D494 Neoplasm of unspecified behavior of bladder: Secondary | ICD-10-CM | POA: Insufficient documentation

## 2017-08-25 DIAGNOSIS — I10 Essential (primary) hypertension: Secondary | ICD-10-CM | POA: Diagnosis not present

## 2017-08-25 DIAGNOSIS — N323 Diverticulum of bladder: Secondary | ICD-10-CM | POA: Diagnosis not present

## 2017-08-25 DIAGNOSIS — K219 Gastro-esophageal reflux disease without esophagitis: Secondary | ICD-10-CM | POA: Insufficient documentation

## 2017-08-25 DIAGNOSIS — D414 Neoplasm of uncertain behavior of bladder: Secondary | ICD-10-CM | POA: Insufficient documentation

## 2017-08-25 DIAGNOSIS — M1990 Unspecified osteoarthritis, unspecified site: Secondary | ICD-10-CM | POA: Insufficient documentation

## 2017-08-25 DIAGNOSIS — D759 Disease of blood and blood-forming organs, unspecified: Secondary | ICD-10-CM | POA: Insufficient documentation

## 2017-08-25 DIAGNOSIS — Z87891 Personal history of nicotine dependence: Secondary | ICD-10-CM | POA: Diagnosis not present

## 2017-08-25 DIAGNOSIS — N359 Urethral stricture, unspecified: Secondary | ICD-10-CM | POA: Diagnosis not present

## 2017-08-25 DIAGNOSIS — E785 Hyperlipidemia, unspecified: Secondary | ICD-10-CM | POA: Diagnosis not present

## 2017-08-25 DIAGNOSIS — F329 Major depressive disorder, single episode, unspecified: Secondary | ICD-10-CM | POA: Diagnosis not present

## 2017-08-25 DIAGNOSIS — Z8546 Personal history of malignant neoplasm of prostate: Secondary | ICD-10-CM | POA: Insufficient documentation

## 2017-08-25 DIAGNOSIS — Z7901 Long term (current) use of anticoagulants: Secondary | ICD-10-CM | POA: Insufficient documentation

## 2017-08-25 DIAGNOSIS — Z79899 Other long term (current) drug therapy: Secondary | ICD-10-CM | POA: Insufficient documentation

## 2017-08-25 HISTORY — PX: TRANSURETHRAL RESECTION OF BLADDER TUMOR: SHX2575

## 2017-08-25 HISTORY — PX: BALLOON DILATION: SHX5330

## 2017-08-25 LAB — POCT I-STAT 4, (NA,K, GLUC, HGB,HCT)
Glucose, Bld: 102 mg/dL — ABNORMAL HIGH (ref 65–99)
HCT: 42 % (ref 39.0–52.0)
Hemoglobin: 14.3 g/dL (ref 13.0–17.0)
Potassium: 4.2 mmol/L (ref 3.5–5.1)
Sodium: 141 mmol/L (ref 135–145)

## 2017-08-25 SURGERY — TURBT (TRANSURETHRAL RESECTION OF BLADDER TUMOR)
Anesthesia: General

## 2017-08-25 MED ORDER — BELLADONNA ALKALOIDS-OPIUM 16.2-60 MG RE SUPP
RECTAL | Status: AC
  Filled 2017-08-25: qty 1

## 2017-08-25 MED ORDER — LACTATED RINGERS IV SOLN
INTRAVENOUS | Status: DC
  Administered 2017-08-25: 07:00:00 via INTRAVENOUS
  Filled 2017-08-25: qty 1000

## 2017-08-25 MED ORDER — ROCURONIUM BROMIDE 50 MG/5ML IV SOSY
PREFILLED_SYRINGE | INTRAVENOUS | Status: AC
  Filled 2017-08-25: qty 5

## 2017-08-25 MED ORDER — SUGAMMADEX SODIUM 200 MG/2ML IV SOLN
INTRAVENOUS | Status: AC
  Filled 2017-08-25: qty 2

## 2017-08-25 MED ORDER — PROPOFOL 10 MG/ML IV BOLUS
INTRAVENOUS | Status: DC | PRN
  Administered 2017-08-25: 150 mg via INTRAVENOUS

## 2017-08-25 MED ORDER — ONDANSETRON HCL 4 MG/2ML IJ SOLN
INTRAMUSCULAR | Status: DC | PRN
  Administered 2017-08-25: 4 mg via INTRAVENOUS

## 2017-08-25 MED ORDER — SUGAMMADEX SODIUM 200 MG/2ML IV SOLN
INTRAVENOUS | Status: DC | PRN
  Administered 2017-08-25: 200 mg via INTRAVENOUS

## 2017-08-25 MED ORDER — DEXAMETHASONE SODIUM PHOSPHATE 10 MG/ML IJ SOLN
INTRAMUSCULAR | Status: DC | PRN
  Administered 2017-08-25: 10 mg via INTRAVENOUS

## 2017-08-25 MED ORDER — EPHEDRINE 5 MG/ML INJ
INTRAVENOUS | Status: AC
  Filled 2017-08-25: qty 10

## 2017-08-25 MED ORDER — SUCCINYLCHOLINE CHLORIDE 200 MG/10ML IV SOSY
PREFILLED_SYRINGE | INTRAVENOUS | Status: DC | PRN
  Administered 2017-08-25: 100 mg via INTRAVENOUS

## 2017-08-25 MED ORDER — LIDOCAINE 2% (20 MG/ML) 5 ML SYRINGE
INTRAMUSCULAR | Status: DC | PRN
  Administered 2017-08-25: 100 mg via INTRAVENOUS

## 2017-08-25 MED ORDER — FENTANYL CITRATE (PF) 100 MCG/2ML IJ SOLN
INTRAMUSCULAR | Status: DC | PRN
  Administered 2017-08-25: 100 ug via INTRAVENOUS

## 2017-08-25 MED ORDER — FENTANYL CITRATE (PF) 100 MCG/2ML IJ SOLN
25.0000 ug | INTRAMUSCULAR | Status: DC | PRN
Start: 2017-08-25 — End: 2017-08-25
  Filled 2017-08-25: qty 1

## 2017-08-25 MED ORDER — EPHEDRINE SULFATE 50 MG/ML IJ SOLN
INTRAMUSCULAR | Status: DC | PRN
  Administered 2017-08-25 (×3): 10 mg via INTRAVENOUS

## 2017-08-25 MED ORDER — ONDANSETRON HCL 4 MG/2ML IJ SOLN
4.0000 mg | Freq: Once | INTRAMUSCULAR | Status: DC | PRN
  Filled 2017-08-25: qty 2

## 2017-08-25 MED ORDER — ACETAMINOPHEN 10 MG/ML IV SOLN
INTRAVENOUS | Status: DC | PRN
  Administered 2017-08-25: 1000 mg via INTRAVENOUS

## 2017-08-25 MED ORDER — HYDROCODONE-ACETAMINOPHEN 10-325 MG PO TABS: 1.0000 | ORAL_TABLET | ORAL | 0 refills | Status: DC | PRN

## 2017-08-25 MED ORDER — ONDANSETRON HCL 4 MG/2ML IJ SOLN
INTRAMUSCULAR | Status: AC
  Filled 2017-08-25: qty 2

## 2017-08-25 MED ORDER — ARTIFICIAL TEARS OPHTHALMIC OINT
TOPICAL_OINTMENT | OPHTHALMIC | Status: AC
  Filled 2017-08-25: qty 3.5

## 2017-08-25 MED ORDER — CIPROFLOXACIN IN D5W 400 MG/200ML IV SOLN
400.0000 mg | INTRAVENOUS | Status: AC
  Administered 2017-08-25: 400 mg via INTRAVENOUS
  Filled 2017-08-25: qty 200

## 2017-08-25 MED ORDER — ROCURONIUM BROMIDE 10 MG/ML (PF) SYRINGE
PREFILLED_SYRINGE | INTRAVENOUS | Status: DC | PRN
  Administered 2017-08-25: 30 mg via INTRAVENOUS

## 2017-08-25 MED ORDER — PHENAZOPYRIDINE HCL 200 MG PO TABS: 200.0000 mg | ORAL_TABLET | Freq: Three times a day (TID) | ORAL | 0 refills | Status: DC | PRN

## 2017-08-25 MED ORDER — DEXAMETHASONE SODIUM PHOSPHATE 10 MG/ML IJ SOLN
INTRAMUSCULAR | Status: AC
  Filled 2017-08-25: qty 1

## 2017-08-25 MED ORDER — SUCCINYLCHOLINE CHLORIDE 200 MG/10ML IV SOSY
PREFILLED_SYRINGE | INTRAVENOUS | Status: AC
  Filled 2017-08-25: qty 10

## 2017-08-25 MED ORDER — VASOPRESSIN 20 UNIT/ML IV SOLN
INTRAVENOUS | Status: DC | PRN
  Administered 2017-08-25 (×2): 1 [IU] via INTRAVENOUS

## 2017-08-25 MED ORDER — CIPROFLOXACIN IN D5W 400 MG/200ML IV SOLN
INTRAVENOUS | Status: AC
Start: 2017-08-25 — End: 2017-08-25
  Filled 2017-08-25: qty 200

## 2017-08-25 MED ORDER — IOHEXOL 300 MG/ML  SOLN
INTRAMUSCULAR | Status: DC | PRN
  Administered 2017-08-25: 10 mL

## 2017-08-25 MED ORDER — ACETAMINOPHEN 10 MG/ML IV SOLN
INTRAVENOUS | Status: AC
  Filled 2017-08-25: qty 100

## 2017-08-25 MED ORDER — LIDOCAINE 2% (20 MG/ML) 5 ML SYRINGE
INTRAMUSCULAR | Status: AC
  Filled 2017-08-25: qty 5

## 2017-08-25 MED ORDER — LIDOCAINE HCL 2 % EX GEL
CUTANEOUS | Status: AC
  Filled 2017-08-25: qty 5

## 2017-08-25 MED ORDER — PROPOFOL 10 MG/ML IV BOLUS
INTRAVENOUS | Status: AC
  Filled 2017-08-25: qty 40

## 2017-08-25 MED ORDER — MITOMYCIN CHEMO FOR BLADDER INSTILLATION 40 MG
40.0000 mg | Freq: Once | INTRAVENOUS | Status: AC
  Administered 2017-08-25: 40 mg via INTRAVESICAL
  Filled 2017-08-25: qty 40

## 2017-08-25 MED ORDER — FENTANYL CITRATE (PF) 100 MCG/2ML IJ SOLN
INTRAMUSCULAR | Status: AC
Start: 2017-08-25 — End: 2017-08-25
  Filled 2017-08-25: qty 2

## 2017-08-25 SURGICAL SUPPLY — 47 items
BAG DRAIN URO-CYSTO SKYTR STRL (DRAIN) ×3 IMPLANT
BAG DRN ANRFLXCHMBR STRAP LEK (BAG)
BAG DRN UROCATH (DRAIN) ×1
BAG URINE DRAINAGE (UROLOGICAL SUPPLIES) IMPLANT
BAG URINE LEG 19OZ MD ST LTX (BAG) IMPLANT
BALLN NEPHROSTOMY (BALLOONS)
BALLOON NEPHROSTOMY (BALLOONS) IMPLANT
CATH FOLEY 2WAY SLVR  5CC 14FR (CATHETERS)
CATH FOLEY 2WAY SLVR  5CC 16FR (CATHETERS)
CATH FOLEY 2WAY SLVR  5CC 20FR (CATHETERS) ×2
CATH FOLEY 2WAY SLVR  5CC 22FR (CATHETERS)
CATH FOLEY 2WAY SLVR  5CC 24FR (CATHETERS)
CATH FOLEY 2WAY SLVR 5CC 14FR (CATHETERS) IMPLANT
CATH FOLEY 2WAY SLVR 5CC 16FR (CATHETERS) IMPLANT
CATH FOLEY 2WAY SLVR 5CC 20FR (CATHETERS) IMPLANT
CATH FOLEY 2WAY SLVR 5CC 22FR (CATHETERS) IMPLANT
CATH FOLEY 2WAY SLVR 5CC 24FR (CATHETERS) ×1 IMPLANT
CATH FOLEY 3WAY 20FR (CATHETERS) IMPLANT
CLOTH BEACON ORANGE TIMEOUT ST (SAFETY) ×6 IMPLANT
ELECT BIVAP BIPO 22/24 DONUT (ELECTROSURGICAL)
ELECT LOOP MED HF 24F 12D (CUTTING LOOP) IMPLANT
ELECT REM PT RETURN 9FT ADLT (ELECTROSURGICAL) ×3
ELECTRD BIVAP BIPO 22/24 DONUT (ELECTROSURGICAL) ×1 IMPLANT
ELECTRODE REM PT RTRN 9FT ADLT (ELECTROSURGICAL) ×1 IMPLANT
EVACUATOR MICROVAS BLADDER (UROLOGICAL SUPPLIES) ×2 IMPLANT
FORCEPS BIOP 2.4F 115CM BACKLD (INSTRUMENTS) ×2 IMPLANT
GLOVE BIO SURGEON STRL SZ8 (GLOVE) ×3 IMPLANT
GOWN STRL REUS W/ TWL LRG LVL3 (GOWN DISPOSABLE) ×1 IMPLANT
GOWN STRL REUS W/ TWL XL LVL3 (GOWN DISPOSABLE) ×1 IMPLANT
GOWN STRL REUS W/TWL LRG LVL3 (GOWN DISPOSABLE) ×3
GOWN STRL REUS W/TWL XL LVL3 (GOWN DISPOSABLE) ×3
GUIDEWIRE STR DUAL SENSOR (WIRE) IMPLANT
HOLDER FOLEY CATH W/STRAP (MISCELLANEOUS) IMPLANT
IV NS IRRIG 3000ML ARTHROMATIC (IV SOLUTION) IMPLANT
KIT RM TURNOVER CYSTO AR (KITS) ×3 IMPLANT
MANIFOLD NEPTUNE II (INSTRUMENTS) ×2 IMPLANT
NDL SAFETY ECLIPSE 18X1.5 (NEEDLE) IMPLANT
NEEDLE HYPO 18GX1.5 SHARP (NEEDLE)
NEEDLE HYPO 22GX1.5 SAFETY (NEEDLE) IMPLANT
NS IRRIG 500ML POUR BTL (IV SOLUTION) IMPLANT
PACK CYSTO (CUSTOM PROCEDURE TRAY) ×3 IMPLANT
PLUG CATH AND CAP STER (CATHETERS) IMPLANT
SYR 20CC LL (SYRINGE) IMPLANT
SYR 30ML LL (SYRINGE) IMPLANT
TUBE CONNECTING 12'X1/4 (SUCTIONS) ×1
TUBE CONNECTING 12X1/4 (SUCTIONS) ×1 IMPLANT
WATER STERILE IRR 3000ML UROMA (IV SOLUTION) ×3 IMPLANT

## 2017-08-25 NOTE — Transfer of Care (Signed)
Immediate Anesthesia Transfer of Care Note  Patient: [name]  Procedure(s) Performed: Procedure(s): TRANSURETHRAL RESECTION OF BLADDER TUMOR (TURBT) (N/A) BALLOON DILATION/ INTRAVESICAL CHEMO (N/A)  Patient Location: PACU  Anesthesia Type:General  Level of Consciousness: awake, alert , oriented and patient cooperative  Airway & Oxygen Therapy: Patient Spontanous Breathing and Patient connected to nasal cannula oxygen  Post-op Assessment: Report given to RN and Post -op Vital signs reviewed and stable  Post vital signs: Reviewed and stable  Last Vitals:  Vitals:   08/25/17 0608  BP: (!) 157/78  Pulse: (!) 57  Resp: 18  Temp: 36.9 C  SpO2: 95%    Last Pain:  Vitals:   08/25/17 0608  TempSrc: Oral      Patients Stated Pain Goal: 5 (46/65/99 [id number])  Complications: No apparent anesthesia complications

## 2017-08-25 NOTE — Discharge Instructions (Signed)
Cystoscopy patient instructions  Following a cystoscopy, a catheter (a flexible rubber tube) is sometimes left in place to empty the bladder. This may cause some discomfort or a feeling that you need to urinate. Your doctor determines the period of time that the catheter will be left in place. You may have bloody urine for two to three days (Call your doctor if the amount of bleeding increases or does not subside).  You may pass blood clots in your urine, especially if you had a biopsy. It is not unusual to pass small blood clots and have some bloody urine a couple of weeks after your cystoscopy. Again, call your doctor if the bleeding does not subside. You may have: Dysuria (painful urination) Frequency (urinating often) Urgency (strong desire to urinate)  These symptoms are common especially if medicine is instilled into the bladder or a ureteral stent is placed. Avoiding alcohol and caffeine, such as coffee, tea, and chocolate, may help relieve these symptoms. Drink plenty of water, unless otherwise instructed. Your doctor may also prescribe an antibiotic or other medicine to reduce these symptoms.  Cystoscopy results are available soon after the procedure; biopsy results usually take two to four days. Your doctor will discuss the results of your exam with you. Before you go home, you will be given specific instructions for follow-up care. Special Instructions:  1 If you are going home with a catheter in place do not take a tub bath until removed by your doctor.  2 You may resume your normal activities.  3 Do not drive or operate machinery if you are taking narcotic pain medicine.  4 Be sure to keep all follow-up appointments with your doctor.   5 Call Your Doctor If: The catheter is not draining  You have severe pain  You are unable to urinate  You have a fever over 101  You have severe bleeding   Transurethral Resection of Bladder Tumor, Care After Refer to this sheet in the  next few weeks. These instructions provide you with information about caring for yourself after your procedure. Your health care provider may also give you more specific instructions. Your treatment has been planned according to current medical practices, but problems sometimes occur. Call your health care provider if you have any problems or questions after your procedure. What can I expect after the procedure? After the procedure, it is common to have: A small amount of blood in your urine for up to 2 weeks. Soreness or mild discomfort from your catheter. After your catheter is removed, you may have mild soreness, especially when urinating. Pain in your lower abdomen. Follow these instructions at home:  Medicines Take over-the-counter and prescription medicines only as told by your health care provider. Do not drive or operate heavy machinery while taking prescription pain medicine. Do not drive for 24 hours if you received a sedative. If you were prescribed antibiotic medicine, take it as told by your health care provider. Do not stop taking the antibiotic even if you start to feel better. Activity Return to your normal activities as told by your health care provider. Ask your health care provider what activities are safe for you. Do not lift anything that is heavier than 10 lb (4.5 kg) for as long as told by your health care provider. Avoid intense physical activity for as long as told by your health care provider. Walk at least one time every day. This helps to prevent blood clots. You may increase your physical activity gradually  as you start to feel better. General instructions Do not drink alcohol for as long as told by your health care provider. This is especially important if you are taking prescription pain medicines. Do not take baths, swim, or use a hot tub until your health care provider approves. If you have a catheter, follow instructions from your health care provider about  caring for your catheter and your drainage bag. Drink enough fluid to keep your urine clear or pale yellow. Wear compression stockings as told by your health care provider. These stockings help to prevent blood clots and reduce swelling in your legs. Keep all follow-up visits as told by your health care provider. This is important. Contact a health care provider if: You have pain that gets worse or does not improve with medicine. You have blood in your urine for more than 2 weeks. You have cloudy or bad-smelling urine. You become constipated. Signs of constipation may include having: Fewer than three bowel movements in a week Difficulty having a bowel movement Stools that are dry, hard, or larger than normal. You have a fever. Get help right away if: You have: Severe pain. Bright-red blood in your urine. Blood clots in your urine. A lot of blood in your urine. Your catheter has been removed and you are not able to urinate. You have a catheter in place and the catheter is not draining urine.  This information is not intended to replace advice given to you by your health care provider. Make sure you discuss any questions you have with your health care provider. Document Released: 11/02/2015 Document Revised: 07/24/2016 Document Reviewed: 08/13/2015  Post Anesthesia Home Care Instructions  Activity: Get plenty of rest for the remainder of the day. A responsible individual must stay with you for 24 hours following the procedure.  For the next 24 hours, DO NOT: -Drive a car -Paediatric nurse -Drink alcoholic beverages -Take any medication unless instructed by your physician -Make any legal decisions or sign important papers.  Meals: Start with liquid foods such as gelatin or soup. Progress to regular foods as tolerated. Avoid greasy, spicy, heavy foods. If nausea and/or vomiting occur, drink only clear liquids until the nausea and/or vomiting subsides. Call your physician if vomiting  continues.  Special Instructions/Symptoms: Your throat may feel dry or sore from the anesthesia or the breathing tube placed in your throat during surgery. If this causes discomfort, gargle with warm salt water. The discomfort should disappear within 24 hours.  If you had a scopolamine patch placed behind your ear for the management of post- operative nausea and/or vomiting:  1. The medication in the patch is effective for 72 hours, after which it should be removed.  Wrap patch in a tissue and discard in the trash. Wash hands thoroughly with soap and water. 2. You may remove the patch earlier than 72 hours if you experience unpleasant side effects which may include dry mouth, dizziness or visual disturbances. 3. Avoid touching the patch. Wash your hands with soap and water after contact with the patch.   Chartered certified accountant Patient Education  [employer].

## 2017-08-25 NOTE — Op Note (Signed)
PATIENT:  Mathew Randolph  PRE-OPERATIVE DIAGNOSIS: 1. Bulbar urethral stricture 2. Bladder tumor  POST-OPERATIVE DIAGNOSIS: Same  PROCEDURE: 1. Cystoscopy with dilatation of bulbar urethral stricture 2. Biopsy of bladder tumor and fulguration (5 mm) 3. Instillation of intravesical chemotherapy  SURGEON:  Claybon Jabs  INDICATION: Mathew Randolph is a 71 year old male with a history of transitional cell carcinoma the bladder who was found to have a small papillary recurrence within a bladder diverticulum. In addition he had a bulbar urethral stricture that was dilated in my office with the cystoscope but we discussed a more formal dilation with the balloon in the operating room at the time of his bladder tumor biopsy.  ANESTHESIA:  General  EBL:  Minimal  DRAINS: 20 French Foley catheter  LOCAL MEDICATIONS USED:  None  SPECIMEN:  Bladder tumor to pathology  Description of procedure: After informed consent the patient was taken to the operating room and placed on the table in a supine position. General anesthesia was then administered. Once fully anesthetized the patient was moved to the dorsal lithotomy position and the genitalia were sterilely prepped and draped in standard fashion. An official timeout was then performed.  I initially passed the extra long resectoscope (patient has semirigid penile prosthesis) down the urethra under direct vision. I noted that the bulbar urethral stricture was fairly patent after the dilation was performed in my office but required further dilation in order to pass the resectoscope into the bladder. I passed a 0.038 inch sensor guidewire through the cystoscope and into the bladder. I then removed the cystoscope and passed the UroMax dilating balloon over the guidewire and across the area of stricture. I dilated the balloon to 14 atm and left it inflated for 2 minutes and then deflated and removed the balloon and guidewire.reinspection with the  resectoscope revealed the stricture was widely patent. I was able to pass the resectoscope through the strictured area without difficulty.  Upon entering the bladder I noted 2+ trabeculation with no tumors, stones or inflammatory lesions noted on the mucosa except for a single papillary lesion found with in a [place] diverticulum on the right floor of the bladder posteriorly. I tested for a obturator reflex with a brief on the cut current and noted he did experience an obturator reflex. I therefore did not feel it would be wise to try and resect the tumor since there is essentially no muscular backing to the diverticulum. I therefore removed the resectoscope and inserted the flexible cystoscope. I used the cold cup biopsy instrument to obtain biopsies of the lesion. These biopsies essentially removed the entire lesion. I then removed the flexible cystoscope and reinserted the resectoscope. I used the fulguration current to fulgurate the location of the tumor thoroughly and then the mucosa surrounding this.  A 20 French Foley catheter was then inserted in the bladder and irrigated. The irrigant returned clear with no clots. The patient was awakened and taken to the recovery room.  While in the recovery room 40 mg of mitomycin-C in 40 cc of water were instilled in the bladder through the catheter and the catheter was plugged. This will remain indwelling for approximately one hour. It will then be drained from the bladder and the catheter will be removed and the patient discharged home.   PLAN OF CARE: Discharge to home after PACU  PATIENT DISPOSITION:  PACU - hemodynamically stable.

## 2017-08-25 NOTE — Anesthesia Procedure Notes (Signed)
Procedure Name: Intubation Date/Time: 08/25/2017 7:42 AM Performed by: Wanita Chamberlain Pre-anesthesia Checklist: Patient identified, Timeout performed, Emergency Drugs available, Suction available and Patient being monitored Patient Re-evaluated:Patient Re-evaluated prior to induction Oxygen Delivery Method: Circle system utilized Preoxygenation: Pre-oxygenation with 100% oxygen Induction Type: IV induction and Cricoid Pressure applied Ventilation: Mask ventilation without difficulty Laryngoscope Size: Mac and 4 Grade View: Grade III Tube type: Oral Tube size: 8.0 mm Number of attempts: 1 Airway Equipment and Method: Stylet Placement Confirmation: ETT inserted through vocal cords under direct vision,  positive ETCO2 and breath sounds checked- equal and bilateral Secured at: 24 cm Tube secured with: Tape Dental Injury: Teeth and Oropharynx as per pre-operative assessment  Difficulty Due To: Difficulty was anticipated and Difficult Airway- due to reduced neck mobility Comments: Bottom of v.cords viewed with cricoid pressure

## 2017-08-25 NOTE — Addendum Note (Signed)
Addendum  created 08/25/17 1041 by Catalina Gravel, MD   Sign clinical note

## 2017-08-25 NOTE — Anesthesia Preprocedure Evaluation (Addendum)
Anesthesia Evaluation  Patient identified by MRN, date of birth, ID band Patient awake    Reviewed: Allergy & Precautions, NPO status , Patient's Chart, lab work & pertinent test results, reviewed documented beta blocker date and time   Airway Mallampati: II  TM Distance: <3 FB Neck ROM: Full    Dental  (+) Teeth Intact, Dental Advisory Given, Implants,    Pulmonary former smoker,    Pulmonary exam normal breath sounds clear to auscultation       Cardiovascular hypertension, Pt. on home beta blockers and Pt. on medications Normal cardiovascular exam Rhythm:Regular Rate:Normal     Neuro/Psych PSYCHIATRIC DISORDERS Depression negative neurological ROS     GI/Hepatic Neg liver ROS, GERD  Medicated,  Endo/Other  Obesity   Renal/GU negative Renal ROS   H/o prostate cancer    Musculoskeletal  (+) Arthritis , Osteoarthritis,    Abdominal   Peds  Hematology  (+) Blood dyscrasia (Warfarin), ,   Anesthesia Other Findings Day of surgery medications reviewed with the patient.  Reproductive/Obstetrics                            Anesthesia Physical Anesthesia Plan  ASA: III  Anesthesia Plan: General   Post-op Pain Management:    Induction: Intravenous  PONV Risk Score and Plan: 2 and Ondansetron and Dexamethasone  Airway Management Planned: Oral ETT  Additional Equipment:   Intra-op Plan:   Post-operative Plan: Extubation in OR  Informed Consent: I have reviewed the patients History and Physical, chart, labs and discussed the procedure including the risks, benefits and alternatives for the proposed anesthesia with the patient or authorized representative who has indicated his/her understanding and acceptance.   Dental advisory given  Plan Discussed with: CRNA  Anesthesia Plan Comments: (Risks/benefits of general anesthesia discussed with patient including risk of damage to teeth,  lips, gum, and tongue, nausea/vomiting, allergic reactions to medications, and the possibility of heart attack, stroke and death.  All patient questions answered.  Patient wishes to proceed.)        Anesthesia Quick Evaluation

## 2017-08-25 NOTE — Anesthesia Postprocedure Evaluation (Addendum)
Anesthesia Post Note  Patient: Mathew Randolph  Procedure(s) Performed: Procedure(s) (LRB): TRANSURETHRAL RESECTION OF BLADDER TUMOR (TURBT) (N/A) BALLOON DILATION/ INTRAVESICAL CHEMO (N/A)     Patient location during evaluation: PACU Anesthesia Type: General Level of consciousness: awake, awake and alert and oriented Pain management: pain level controlled Vital Signs Assessment: post-procedure vital signs reviewed and stable Respiratory status: spontaneous breathing, nonlabored ventilation and respiratory function stable Cardiovascular status: stable and blood pressure returned to baseline Postop Assessment: no headache and no signs of nausea or vomiting Anesthetic complications: no    Last Vitals:  Vitals:   08/25/17 0608 08/25/17 0831  BP: (!) 157/78   Pulse: (!) 57 62  Resp: 18 14  Temp: 36.9 C 36.9 C  SpO2: 95% 96%    Last Pain:  Vitals:   08/25/17 7353  TempSrc: Oral                 Catalina Gravel

## 2017-08-28 ENCOUNTER — Encounter (HOSPITAL_BASED_OUTPATIENT_CLINIC_OR_DEPARTMENT_OTHER): Payer: Self-pay | Admitting: Urology

## 2017-08-29 DIAGNOSIS — H02831 Dermatochalasis of right upper eyelid: Secondary | ICD-10-CM | POA: Diagnosis not present

## 2017-08-29 DIAGNOSIS — H25811 Combined forms of age-related cataract, right eye: Secondary | ICD-10-CM | POA: Diagnosis not present

## 2017-08-29 DIAGNOSIS — H25812 Combined forms of age-related cataract, left eye: Secondary | ICD-10-CM | POA: Diagnosis not present

## 2017-09-05 DIAGNOSIS — Z125 Encounter for screening for malignant neoplasm of prostate: Secondary | ICD-10-CM | POA: Diagnosis not present

## 2017-09-05 DIAGNOSIS — R6889 Other general symptoms and signs: Secondary | ICD-10-CM | POA: Diagnosis not present

## 2017-09-05 DIAGNOSIS — E291 Testicular hypofunction: Secondary | ICD-10-CM | POA: Diagnosis not present

## 2017-09-05 DIAGNOSIS — M1990 Unspecified osteoarthritis, unspecified site: Secondary | ICD-10-CM | POA: Diagnosis not present

## 2017-09-05 DIAGNOSIS — D6862 Lupus anticoagulant syndrome: Secondary | ICD-10-CM | POA: Diagnosis not present

## 2017-09-05 DIAGNOSIS — Z23 Encounter for immunization: Secondary | ICD-10-CM | POA: Diagnosis not present

## 2017-09-05 DIAGNOSIS — Z6833 Body mass index (BMI) 33.0-33.9, adult: Secondary | ICD-10-CM | POA: Diagnosis not present

## 2017-09-05 DIAGNOSIS — C61 Malignant neoplasm of prostate: Secondary | ICD-10-CM | POA: Diagnosis not present

## 2017-09-05 DIAGNOSIS — E782 Mixed hyperlipidemia: Secondary | ICD-10-CM | POA: Diagnosis not present

## 2017-09-07 DIAGNOSIS — Z9841 Cataract extraction status, right eye: Secondary | ICD-10-CM | POA: Diagnosis not present

## 2017-09-07 DIAGNOSIS — H25811 Combined forms of age-related cataract, right eye: Secondary | ICD-10-CM | POA: Diagnosis not present

## 2017-09-07 DIAGNOSIS — H5212 Myopia, left eye: Secondary | ICD-10-CM | POA: Diagnosis not present

## 2017-09-07 DIAGNOSIS — H2511 Age-related nuclear cataract, right eye: Secondary | ICD-10-CM | POA: Diagnosis not present

## 2017-09-07 DIAGNOSIS — H52221 Regular astigmatism, right eye: Secondary | ICD-10-CM | POA: Diagnosis not present

## 2017-09-07 DIAGNOSIS — Z961 Presence of intraocular lens: Secondary | ICD-10-CM | POA: Diagnosis not present

## 2017-09-07 DIAGNOSIS — H5201 Hypermetropia, right eye: Secondary | ICD-10-CM | POA: Diagnosis not present

## 2017-09-11 DIAGNOSIS — Z7901 Long term (current) use of anticoagulants: Secondary | ICD-10-CM | POA: Diagnosis not present

## 2017-09-11 DIAGNOSIS — Z86718 Personal history of other venous thrombosis and embolism: Secondary | ICD-10-CM | POA: Diagnosis not present

## 2017-09-13 DIAGNOSIS — I8291 Chronic embolism and thrombosis of unspecified vein: Secondary | ICD-10-CM | POA: Diagnosis not present

## 2017-09-15 DIAGNOSIS — Z23 Encounter for immunization: Secondary | ICD-10-CM | POA: Diagnosis not present

## 2017-09-19 DIAGNOSIS — I8291 Chronic embolism and thrombosis of unspecified vein: Secondary | ICD-10-CM | POA: Diagnosis not present

## 2017-09-21 DIAGNOSIS — H5203 Hypermetropia, bilateral: Secondary | ICD-10-CM | POA: Diagnosis not present

## 2017-09-21 DIAGNOSIS — Z9849 Cataract extraction status, unspecified eye: Secondary | ICD-10-CM | POA: Diagnosis not present

## 2017-09-21 DIAGNOSIS — H2512 Age-related nuclear cataract, left eye: Secondary | ICD-10-CM | POA: Diagnosis not present

## 2017-09-21 DIAGNOSIS — H52223 Regular astigmatism, bilateral: Secondary | ICD-10-CM | POA: Diagnosis not present

## 2017-09-21 DIAGNOSIS — H25812 Combined forms of age-related cataract, left eye: Secondary | ICD-10-CM | POA: Diagnosis not present

## 2017-09-21 DIAGNOSIS — Z961 Presence of intraocular lens: Secondary | ICD-10-CM | POA: Diagnosis not present

## 2017-10-03 DIAGNOSIS — I8291 Chronic embolism and thrombosis of unspecified vein: Secondary | ICD-10-CM | POA: Diagnosis not present

## 2017-10-16 DIAGNOSIS — Z86718 Personal history of other venous thrombosis and embolism: Secondary | ICD-10-CM | POA: Diagnosis not present

## 2017-10-16 DIAGNOSIS — Z7901 Long term (current) use of anticoagulants: Secondary | ICD-10-CM | POA: Diagnosis not present

## 2017-10-30 DIAGNOSIS — Z6834 Body mass index (BMI) 34.0-34.9, adult: Secondary | ICD-10-CM | POA: Diagnosis not present

## 2017-10-30 DIAGNOSIS — J209 Acute bronchitis, unspecified: Secondary | ICD-10-CM | POA: Diagnosis not present

## 2017-11-02 DIAGNOSIS — I8291 Chronic embolism and thrombosis of unspecified vein: Secondary | ICD-10-CM | POA: Diagnosis not present

## 2017-11-02 DIAGNOSIS — I10 Essential (primary) hypertension: Secondary | ICD-10-CM | POA: Diagnosis not present

## 2017-11-02 DIAGNOSIS — F900 Attention-deficit hyperactivity disorder, predominantly inattentive type: Secondary | ICD-10-CM | POA: Diagnosis not present

## 2017-11-06 DIAGNOSIS — I8291 Chronic embolism and thrombosis of unspecified vein: Secondary | ICD-10-CM | POA: Diagnosis not present

## 2017-11-08 DIAGNOSIS — L918 Other hypertrophic disorders of the skin: Secondary | ICD-10-CM | POA: Diagnosis not present

## 2017-11-08 DIAGNOSIS — L821 Other seborrheic keratosis: Secondary | ICD-10-CM | POA: Diagnosis not present

## 2017-11-08 DIAGNOSIS — Z85828 Personal history of other malignant neoplasm of skin: Secondary | ICD-10-CM | POA: Diagnosis not present

## 2017-11-08 DIAGNOSIS — K130 Diseases of lips: Secondary | ICD-10-CM | POA: Diagnosis not present

## 2017-11-08 DIAGNOSIS — L218 Other seborrheic dermatitis: Secondary | ICD-10-CM | POA: Diagnosis not present

## 2017-11-08 DIAGNOSIS — D225 Melanocytic nevi of trunk: Secondary | ICD-10-CM | POA: Diagnosis not present

## 2017-11-08 DIAGNOSIS — L814 Other melanin hyperpigmentation: Secondary | ICD-10-CM | POA: Diagnosis not present

## 2017-11-08 DIAGNOSIS — L300 Nummular dermatitis: Secondary | ICD-10-CM | POA: Diagnosis not present

## 2017-11-08 DIAGNOSIS — D224 Melanocytic nevi of scalp and neck: Secondary | ICD-10-CM | POA: Diagnosis not present

## 2017-11-08 DIAGNOSIS — D1801 Hemangioma of skin and subcutaneous tissue: Secondary | ICD-10-CM | POA: Diagnosis not present

## 2017-11-08 DIAGNOSIS — L603 Nail dystrophy: Secondary | ICD-10-CM | POA: Diagnosis not present

## 2017-11-13 DIAGNOSIS — Z7901 Long term (current) use of anticoagulants: Secondary | ICD-10-CM | POA: Diagnosis not present

## 2017-11-13 DIAGNOSIS — Z86718 Personal history of other venous thrombosis and embolism: Secondary | ICD-10-CM | POA: Diagnosis not present

## 2017-11-20 DIAGNOSIS — I8291 Chronic embolism and thrombosis of unspecified vein: Secondary | ICD-10-CM | POA: Diagnosis not present

## 2018-05-31 LAB — PROTIME-INR

## 2018-08-02 ENCOUNTER — Telehealth: Payer: Self-pay | Admitting: Cardiology

## 2018-08-02 NOTE — Telephone Encounter (Signed)
New message:     Pt is calling and would like a appt with [facility] a lot sooner that 9/26. Pt states he is going out of town to [place] and is having some continued issues with atrial fib

## 2018-08-03 NOTE — Telephone Encounter (Signed)
Follow up scheduled
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
## 2018-08-15 ENCOUNTER — Telehealth: Payer: Self-pay | Admitting: Cardiology

## 2018-08-15 ENCOUNTER — Encounter: Payer: Self-pay | Admitting: Cardiology

## 2018-08-15 ENCOUNTER — Ambulatory Visit (INDEPENDENT_AMBULATORY_CARE_PROVIDER_SITE_OTHER): Payer: Medicare Other | Admitting: Cardiology

## 2018-08-15 ENCOUNTER — Encounter

## 2018-08-15 VITALS — BP 148/84 | HR 86 | Ht 71.0 in | Wt 225.0 lb

## 2018-08-15 DIAGNOSIS — Z79899 Other long term (current) drug therapy: Secondary | ICD-10-CM

## 2018-08-15 DIAGNOSIS — I481 Persistent atrial fibrillation: Secondary | ICD-10-CM | POA: Diagnosis not present

## 2018-08-15 DIAGNOSIS — Z01812 Encounter for preprocedural laboratory examination: Secondary | ICD-10-CM | POA: Diagnosis not present

## 2018-08-15 DIAGNOSIS — I4819 Other persistent atrial fibrillation: Secondary | ICD-10-CM

## 2018-08-15 DIAGNOSIS — I10 Essential (primary) hypertension: Secondary | ICD-10-CM

## 2018-08-15 DIAGNOSIS — E7800 Pure hypercholesterolemia, unspecified: Secondary | ICD-10-CM

## 2018-08-15 NOTE — Patient Instructions (Addendum)
Medication Instructions: Your physician recommends that you continue on your current medications as directed.    If you need a refill on your cardiac medications before your next appointment, please call your pharmacy.   Labwork: Your physician recommends that you return for lab work in: Today (TSH, CBC, BMP)    Procedures/Testing: Your physician has requested that you have an echocardiogram. Echocardiography is a painless test that uses sound waves to create images of your heart. It provides your doctor with information about the size and shape of your heart and how well your heart's chambers and valves are working. This procedure takes approximately one hour. There are no restrictions for this procedure. [facility]   Follow-Up: Your physician wants you to follow-up in 4 weeks.  Special Instructions: Call office with INR level [phone] or [phone]   Thank you for choosing [facility] at [facility]!!

## 2018-08-15 NOTE — Telephone Encounter (Signed)
Looks like you asked him to send this info.

## 2018-08-15 NOTE — Telephone Encounter (Signed)
Please schedule elective DCCV with next available date (anyone can do) and notify pt and place orders; Place INRs in chart. thx Kirk Ruths, MD

## 2018-08-15 NOTE — Telephone Encounter (Signed)
Returned call to patient, patient requesting to proceed with scheduling cardioversion asap  He has echo next week and would like to go ahead and schedule DCCV if Dr. Stanford Breed okay to proceed (he is aware this would need to be after echo).    Advised would route to Dr. Stanford Breed to review and advise on scheduling cardioversion.  INR results in duplicate phone note has been forwarded to Dr. Stanford Breed.

## 2018-08-15 NOTE — Telephone Encounter (Signed)
Routed to pharmD 

## 2018-08-15 NOTE — Telephone Encounter (Signed)
Follow Up:     Patient is requesting a call back

## 2018-08-15 NOTE — Telephone Encounter (Signed)
New Message:     INR:  9/9       2.4 9/3       3.0 8/28     3.9 8/19     2.2 8/14     4.4 8/5       2.7

## 2018-08-16 ENCOUNTER — Other Ambulatory Visit: Payer: Self-pay | Admitting: *Deleted

## 2018-08-16 DIAGNOSIS — I4819 Other persistent atrial fibrillation: Secondary | ICD-10-CM

## 2018-08-16 LAB — BASIC METABOLIC PANEL
BUN / CREAT RATIO: 13 (ref 10–24)
BUN: 16 mg/dL (ref 8–27)
CHLORIDE: 105 mmol/L (ref 96–106)
CO2: 19 mmol/L — ABNORMAL LOW (ref 20–29)
Calcium: 9.2 mg/dL (ref 8.6–10.2)
Creatinine, Ser: 1.27 mg/dL (ref 0.76–1.27)
GFR calc Af Amer: 65 mL/min/{1.73_m2} (ref >59)
GFR calc non Af Amer: 56 mL/min/{1.73_m2} — ABNORMAL LOW (ref >59)
GLUCOSE: 101 mg/dL — AB (ref 65–99)
Potassium: 4.4 mmol/L (ref 3.5–5.2)
SODIUM: 140 mmol/L (ref 134–144)

## 2018-08-16 LAB — CBC
HEMOGLOBIN: 14.6 g/dL (ref 13.0–17.7)
Hematocrit: 49.8 % (ref 37.5–51.0)
MCH: 21.8 pg — AB (ref 26.6–33.0)
MCHC: 29.3 g/dL — AB (ref 31.5–35.7)
MCV: 74 fL — ABNORMAL LOW (ref 79–97)
PLATELETS: 277 10*3/uL (ref 150–450)
RBC: 6.69 x10E6/uL — ABNORMAL HIGH (ref 4.14–5.80)
RDW: 19.5 % — AB (ref 12.3–15.4)
WBC: 7.3 10*3/uL (ref 3.4–10.8)

## 2018-08-16 LAB — TSH: TSH: 3.54 u[IU]/mL (ref 0.450–4.500)

## 2018-08-16 NOTE — Telephone Encounter (Signed)
Echo scheduled 9/18 1st available DCCV scheduled 9/23 at 9:30 AM with Dr. Aundra Dubin (booking # [phone]) Labs to be completed on 9/20 (within 3 days of procedure)   Instruction letter completed.  Reviewed with patient and patient verbalized understanding.   Advised to call with any questions or concerns.   Spoke to pharmD, unable to place INR readings as a "result".  Advised to send MD who is performing cardioversion to make aware of INR results in telephone encounter.  Message sent to Dr. Aundra Dubin to make aware.      Procedure orders placed

## 2018-08-22 ENCOUNTER — Ambulatory Visit (HOSPITAL_BASED_OUTPATIENT_CLINIC_OR_DEPARTMENT_OTHER)
Admission: RE | Admit: 2018-08-22 | Discharge: 2018-08-22 | Disposition: A | Discharge status: Home / Self Care | Payer: Medicare Other | Source: Ambulatory Visit | Attending: Cardiology | Admitting: Cardiology

## 2018-08-22 DIAGNOSIS — I4819 Other persistent atrial fibrillation: Secondary | ICD-10-CM

## 2018-08-22 DIAGNOSIS — I088 Other rheumatic multiple valve diseases: Secondary | ICD-10-CM | POA: Diagnosis not present

## 2018-08-22 DIAGNOSIS — I481 Persistent atrial fibrillation: Secondary | ICD-10-CM | POA: Diagnosis present

## 2018-08-22 DIAGNOSIS — I77810 Thoracic aortic ectasia: Secondary | ICD-10-CM | POA: Insufficient documentation

## 2018-08-22 DIAGNOSIS — I119 Hypertensive heart disease without heart failure: Secondary | ICD-10-CM | POA: Insufficient documentation

## 2018-08-22 DIAGNOSIS — E785 Hyperlipidemia, unspecified: Secondary | ICD-10-CM | POA: Diagnosis not present

## 2018-08-22 NOTE — Progress Notes (Signed)
  Echocardiogram 2D Echocardiogram has been performed.  Nirali Magouirk T Abishai Viegas 08/22/2018, 11:50 AM

## 2018-08-25 LAB — BASIC METABOLIC PANEL
BUN/Creatinine Ratio: 10 (ref 10–24)
BUN: 13 mg/dL (ref 8–27)
CALCIUM: 9.2 mg/dL (ref 8.6–10.2)
CO2: 24 mmol/L (ref 20–29)
Chloride: 105 mmol/L (ref 96–106)
Creatinine, Ser: 1.3 mg/dL — ABNORMAL HIGH (ref 0.76–1.27)
GFR calc Af Amer: 63 mL/min/{1.73_m2} (ref >59)
GFR, EST NON AFRICAN AMERICAN: 55 mL/min/{1.73_m2} — AB (ref >59)
Glucose: 99 mg/dL (ref 65–99)
POTASSIUM: 4.4 mmol/L (ref 3.5–5.2)
Sodium: 140 mmol/L (ref 134–144)

## 2018-08-25 LAB — CBC
HEMATOCRIT: 45.7 % (ref 37.5–51.0)
HEMOGLOBIN: 14.2 g/dL (ref 13.0–17.7)
MCH: 23.2 pg — ABNORMAL LOW (ref 26.6–33.0)
MCHC: 31.1 g/dL — ABNORMAL LOW (ref 31.5–35.7)
MCV: 75 fL — ABNORMAL LOW (ref 79–97)
Platelets: 216 10*3/uL (ref 150–450)
RBC: 6.13 x10E6/uL — AB (ref 4.14–5.80)
RDW: 19 % — AB (ref 12.3–15.4)
WBC: 5.9 10*3/uL (ref 3.4–10.8)

## 2018-08-25 LAB — PROTIME-INR
INR: 4.9 — ABNORMAL HIGH (ref 0.8–1.2)
PROTHROMBIN TIME: 44.1 s — AB (ref 9.1–12.0)

## 2018-08-27 ENCOUNTER — Ambulatory Visit (HOSPITAL_COMMUNITY)
Admission: RE | Admit: 2018-08-27 | Discharge: 2018-08-27 | Disposition: A | Discharge status: Home / Self Care | Payer: Medicare Other | Source: Ambulatory Visit | Attending: Cardiology | Admitting: Cardiology

## 2018-08-27 ENCOUNTER — Encounter (HOSPITAL_COMMUNITY): Payer: Self-pay | Admitting: *Deleted

## 2018-08-27 ENCOUNTER — Ambulatory Visit (HOSPITAL_COMMUNITY): Payer: Medicare Other | Admitting: Anesthesiology

## 2018-08-27 ENCOUNTER — Encounter (HOSPITAL_COMMUNITY)
Admission: RE | Disposition: A | Discharge status: Home / Self Care | Payer: Self-pay | Source: Ambulatory Visit | Attending: Cardiology

## 2018-08-27 DIAGNOSIS — I10 Essential (primary) hypertension: Secondary | ICD-10-CM | POA: Diagnosis not present

## 2018-08-27 DIAGNOSIS — Z7901 Long term (current) use of anticoagulants: Secondary | ICD-10-CM | POA: Diagnosis not present

## 2018-08-27 DIAGNOSIS — Z8551 Personal history of malignant neoplasm of bladder: Secondary | ICD-10-CM | POA: Diagnosis not present

## 2018-08-27 DIAGNOSIS — Z87891 Personal history of nicotine dependence: Secondary | ICD-10-CM | POA: Diagnosis not present

## 2018-08-27 DIAGNOSIS — Z87828 Personal history of other (healed) physical injury and trauma: Secondary | ICD-10-CM | POA: Insufficient documentation

## 2018-08-27 DIAGNOSIS — I81 Portal vein thrombosis: Secondary | ICD-10-CM | POA: Diagnosis not present

## 2018-08-27 DIAGNOSIS — Z87442 Personal history of urinary calculi: Secondary | ICD-10-CM | POA: Diagnosis not present

## 2018-08-27 DIAGNOSIS — Z9221 Personal history of antineoplastic chemotherapy: Secondary | ICD-10-CM | POA: Diagnosis not present

## 2018-08-27 DIAGNOSIS — I4891 Unspecified atrial fibrillation: Secondary | ICD-10-CM | POA: Insufficient documentation

## 2018-08-27 DIAGNOSIS — Z9889 Other specified postprocedural states: Secondary | ICD-10-CM | POA: Insufficient documentation

## 2018-08-27 DIAGNOSIS — E785 Hyperlipidemia, unspecified: Secondary | ICD-10-CM | POA: Diagnosis not present

## 2018-08-27 DIAGNOSIS — Z888 Allergy status to other drugs, medicaments and biological substances status: Secondary | ICD-10-CM | POA: Diagnosis not present

## 2018-08-27 DIAGNOSIS — K219 Gastro-esophageal reflux disease without esophagitis: Secondary | ICD-10-CM | POA: Diagnosis not present

## 2018-08-27 DIAGNOSIS — D6862 Lupus anticoagulant syndrome: Secondary | ICD-10-CM | POA: Insufficient documentation

## 2018-08-27 DIAGNOSIS — F329 Major depressive disorder, single episode, unspecified: Secondary | ICD-10-CM | POA: Insufficient documentation

## 2018-08-27 DIAGNOSIS — J45909 Unspecified asthma, uncomplicated: Secondary | ICD-10-CM | POA: Insufficient documentation

## 2018-08-27 DIAGNOSIS — Z882 Allergy status to sulfonamides status: Secondary | ICD-10-CM | POA: Insufficient documentation

## 2018-08-27 DIAGNOSIS — Z8546 Personal history of malignant neoplasm of prostate: Secondary | ICD-10-CM | POA: Insufficient documentation

## 2018-08-27 DIAGNOSIS — Z79899 Other long term (current) drug therapy: Secondary | ICD-10-CM | POA: Diagnosis not present

## 2018-08-27 DIAGNOSIS — I4819 Other persistent atrial fibrillation: Secondary | ICD-10-CM

## 2018-08-27 DIAGNOSIS — M1990 Unspecified osteoarthritis, unspecified site: Secondary | ICD-10-CM | POA: Diagnosis not present

## 2018-08-27 HISTORY — PX: CARDIOVERSION: SHX1299

## 2018-08-27 SURGERY — CARDIOVERSION
Anesthesia: General

## 2018-08-27 MED ORDER — LIDOCAINE 2% (20 MG/ML) 5 ML SYRINGE
INTRAMUSCULAR | Status: DC | PRN
  Administered 2018-08-27: 60 mg via INTRAVENOUS

## 2018-08-27 MED ORDER — PROPOFOL 10 MG/ML IV BOLUS
INTRAVENOUS | Status: DC | PRN
  Administered 2018-08-27 (×2): 20 mg via INTRAVENOUS
  Administered 2018-08-27: 60 mg via INTRAVENOUS

## 2018-08-27 MED ORDER — SODIUM CHLORIDE 0.9 % IV SOLN
INTRAVENOUS | Status: DC
  Administered 2018-08-27: 09:00:00 via INTRAVENOUS

## 2018-08-27 NOTE — Progress Notes (Signed)
Patient has INR checked at home on 9.23019. Result was 2.3.

## 2018-08-27 NOTE — Anesthesia Postprocedure Evaluation (Signed)
Anesthesia Post Note  Patient: Mathew Randolph  Procedure(s) Performed: CARDIOVERSION (N/A )     Patient location during evaluation: PACU Anesthesia Type: General Level of consciousness: awake and alert Pain management: pain level controlled Vital Signs Assessment: post-procedure vital signs reviewed and stable Respiratory status: spontaneous breathing, nonlabored ventilation and respiratory function stable Cardiovascular status: blood pressure returned to baseline and stable Postop Assessment: no apparent nausea or vomiting Anesthetic complications: no    Last Vitals:  Vitals:   08/27/18 0925 08/27/18 0935  BP: 127/89 123/83  Pulse: 60   Resp: (!) 21 20  Temp:    SpO2: 96% 96%    Last Pain:  Vitals:   08/27/18 0935  TempSrc:   PainSc: 0-No pain                 Kaesha Kirsch,W. EDMOND

## 2018-08-27 NOTE — Anesthesia Preprocedure Evaluation (Addendum)
Anesthesia Evaluation  Patient identified by MRN, date of birth, ID band Patient awake    Reviewed: Allergy & Precautions, H&P , NPO status , Patient's Chart, lab work & pertinent test results  Airway Mallampati: III  TM Distance: >3 FB Neck ROM: Full    Dental no notable dental hx. (+) Teeth Intact, Dental Advisory Given   Pulmonary asthma , former smoker,    Pulmonary exam normal breath sounds clear to auscultation       Cardiovascular hypertension, Pt. on medications and Pt. on home beta blockers  Rhythm:Irregular Rate:Normal     Neuro/Psych Depression negative neurological ROS     GI/Hepatic Neg liver ROS, GERD  Medicated and Controlled,  Endo/Other  negative endocrine ROS  Renal/GU negative Renal ROS  negative genitourinary   Musculoskeletal  (+) Arthritis , Osteoarthritis,    Abdominal   Peds  Hematology negative hematology ROS (+)   Anesthesia Other Findings   Reproductive/Obstetrics negative OB ROS                            Anesthesia Physical Anesthesia Plan  ASA: III  Anesthesia Plan: General   Post-op Pain Management:    Induction: Intravenous  PONV Risk Score and Plan: 2 and Treatment may vary due to age or medical condition  Airway Management Planned: Mask  Additional Equipment:   Intra-op Plan:   Post-operative Plan:   Informed Consent: I have reviewed the patients History and Physical, chart, labs and discussed the procedure including the risks, benefits and alternatives for the proposed anesthesia with the patient or authorized representative who has indicated his/her understanding and acceptance.   Dental advisory given  Plan Discussed with: CRNA  Anesthesia Plan Comments:         Anesthesia Quick Evaluation

## 2018-08-27 NOTE — Interval H&P Note (Signed)
History and Physical Interval Note:  08/27/2018 9:09 AM  Mathew Randolph  has presented today for surgery, with the diagnosis of a-fib  The various methods of treatment have been discussed with the patient and family. After consideration of risks, benefits and other options for treatment, the patient has consented to  Procedure(s): CARDIOVERSION (N/A) as a surgical intervention .  The patient's history has been reviewed, patient examined, no change in status, stable for surgery.  I have reviewed the patient's chart and labs.  Questions were answered to the patient's satisfaction.     [name] [employer]

## 2018-08-27 NOTE — Procedures (Signed)
Electrical Cardioversion Procedure Note CRESTON KLAS [id number] [birth year]  Procedure: Electrical Cardioversion Indications:  Atrial Fibrillation  Procedure Details Consent: Risks of procedure as well as the alternatives and risks of each were explained to the (patient/caregiver).  Consent for procedure obtained. Time Out: Verified patient identification, verified procedure, site/side was marked, verified correct patient position, special equipment/implants available, medications/allergies/relevent history reviewed, required imaging and test results available.  Performed  Patient placed on cardiac monitor, pulse oximetry, supplemental oxygen as necessary.  Sedation given: Propofol per anesthesiology Pacer pads placed anterior and posterior chest.  Cardioverted 1 time(s).  Cardioverted at [place].  Evaluation Findings: Post procedure EKG shows: NSR Complications: None Patient did tolerate procedure well.   Loralie Champagne 08/27/2018, 9:40 AM

## 2018-08-27 NOTE — Discharge Instructions (Signed)
Electrical Cardioversion, Care After °This sheet gives you information about how to care for yourself after your procedure. Your health care provider may also give you more specific instructions. If you have problems or questions, contact your health care provider. °What can I expect after the procedure? °After the procedure, it is common to have: °· Some redness on the skin where the shocks were given. ° °Follow these instructions at home: °· Do not drive for 24 hours if you were given a medicine to help you relax (sedative). °· Take over-the-counter and prescription medicines only as told by your health care provider. °· Ask your health care provider how to check your pulse. Check it often. °· Rest for 48 hours after the procedure or as told by your health care provider. °· Avoid or limit your caffeine use as told by your health care provider. °Contact a health care provider if: °· You feel like your heart is beating too quickly or your pulse is not regular. °· You have a serious muscle cramp that does not go away. °Get help right away if: °· You have discomfort in your chest. °· You are dizzy or you feel faint. °· You have trouble breathing or you are short of breath. °· Your speech is slurred. °· You have trouble moving an arm or leg on one side of your body. °· Your fingers or toes turn cold or blue. °This information is not intended to replace advice given to you by your health care provider. Make sure you discuss any questions you have with your health care provider. °Document Released: 09/11/2013 Document Revised: 06/24/2016 Document Reviewed: 05/27/2016 °Elsevier Interactive Patient Education © 2018 [employer] ° °

## 2018-08-27 NOTE — Anesthesia Procedure Notes (Signed)
Procedure Name: General with mask airway Date/Time: 08/27/2018 9:05 AM Performed by: White, Amedeo Plenty, CRNA Pre-anesthesia Checklist: Patient identified, Emergency Drugs available, Suction available and Patient being monitored Patient Re-evaluated:Patient Re-evaluated prior to induction Oxygen Delivery Method: Ambu bag Preoxygenation: Pre-oxygenation with 100% oxygen

## 2018-08-27 NOTE — Transfer of Care (Signed)
Immediate Anesthesia Transfer of Care Note  Patient: Mathew Randolph  Procedure(s) Performed: CARDIOVERSION (N/A )  Patient Location: Endoscopy Unit  Anesthesia Type:General  Level of Consciousness: drowsy, patient cooperative and responds to stimulation  Airway & Oxygen Therapy: Patient Spontanous Breathing  Post-op Assessment: Report given to RN and Post -op Vital signs reviewed and stable  Post vital signs: Reviewed and stable  Last Vitals:  Vitals Value Taken Time  BP 128/87 08/27/2018  9:13 AM  Temp    Pulse 71 08/27/2018  9:16 AM  Resp 21 08/27/2018  9:16 AM  SpO2 99 % 08/27/2018  9:16 AM    Last Pain:  Vitals:   08/27/18 0843  TempSrc: Oral  PainSc: 0-No pain         Complications: No apparent anesthesia complications

## 2018-08-28 ENCOUNTER — Encounter: Payer: Self-pay | Admitting: Cardiology

## 2018-08-30 ENCOUNTER — Ambulatory Visit: Payer: Medicare Other | Admitting: Cardiology

## 2018-09-04 ENCOUNTER — Telehealth: Payer: Self-pay | Admitting: Cardiology

## 2018-09-04 NOTE — Telephone Encounter (Signed)
Mr Buch is requesting that his echocardiogram results sent to PCP   Dr Otelia Santee Fax number: [phone]
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
## 2018-09-06 NOTE — Telephone Encounter (Signed)
Echo results fzxed to the number provided.

## 2018-09-12 ENCOUNTER — Encounter: Payer: Self-pay | Admitting: Cardiology

## 2018-09-12 ENCOUNTER — Ambulatory Visit (INDEPENDENT_AMBULATORY_CARE_PROVIDER_SITE_OTHER): Payer: Medicare Other | Admitting: Cardiology

## 2018-09-12 VITALS — BP 148/78 | HR 48 | Ht 71.0 in | Wt 226.4 lb

## 2018-09-12 DIAGNOSIS — E7800 Pure hypercholesterolemia, unspecified: Secondary | ICD-10-CM | POA: Diagnosis not present

## 2018-09-12 DIAGNOSIS — R0989 Other specified symptoms and signs involving the circulatory and respiratory systems: Secondary | ICD-10-CM

## 2018-09-12 DIAGNOSIS — I4819 Other persistent atrial fibrillation: Secondary | ICD-10-CM

## 2018-09-12 DIAGNOSIS — Z87891 Personal history of nicotine dependence: Secondary | ICD-10-CM

## 2018-09-12 MED ORDER — METOPROLOL SUCCINATE ER 25 MG PO TB24: 25.0000 mg | ORAL_TABLET | Freq: Every day | ORAL | 3 refills | Status: DC

## 2018-09-12 MED ORDER — AMLODIPINE BESYLATE 5 MG PO TABS: 5.0000 mg | ORAL_TABLET | Freq: Every day | ORAL | 3 refills | Status: DC

## 2018-09-12 NOTE — Patient Instructions (Signed)
Medication Instructions:  DECREASE METOPROLOL TO 25 MG ONCE DAILY= 1/2 OF THE 50 MG TABLET ONCE DAILY  START AMLODIPINE 5 MG ONCE DAILY If you need a refill on your cardiac medications before your next appointment, please call your pharmacy.   Lab work: If you have labs (blood work) drawn today and your tests are completely normal, you will receive your results only by: Marland Kitchen MyChart Message (if you have MyChart) OR . A paper copy in the mail If you have any lab test that is abnormal or we need to change your treatment, we will call you to review the results.  Testing/Procedures: Your physician has requested that you have an abdominal aorta duplex. During this test, an ultrasound is used to evaluate the aorta. Allow 30 minutes for this exam. Do not eat after midnight the day before and avoid carbonated beverages AT THE [place] LOCATION  Follow-Up: Your physician wants you to follow-up in: [place] will receive a reminder letter in the mail two months in advance. If you don't receive a letter, please call our office to schedule the follow-up appointment.

## 2018-09-17 ENCOUNTER — Telehealth: Payer: Self-pay | Admitting: Cardiology

## 2018-09-17 NOTE — Telephone Encounter (Signed)
Recent lab results faxed to pt pcp Dr.Malone . Fax # previously provided by pt. [phone]

## 2018-09-17 NOTE — Telephone Encounter (Signed)
° ° °  Patient calling to request labs from 9/20 be re- faxed to Dr Talbert Forest, stating they only received results from echo

## 2018-09-21 ENCOUNTER — Ambulatory Visit (HOSPITAL_BASED_OUTPATIENT_CLINIC_OR_DEPARTMENT_OTHER)
Admission: RE | Admit: 2018-09-21 | Discharge: 2018-09-21 | Disposition: A | Discharge status: Home / Self Care | Payer: Medicare Other | Source: Ambulatory Visit | Attending: Cardiology | Admitting: Cardiology

## 2018-09-21 DIAGNOSIS — Z87891 Personal history of nicotine dependence: Secondary | ICD-10-CM

## 2018-09-21 DIAGNOSIS — Z136 Encounter for screening for cardiovascular disorders: Secondary | ICD-10-CM

## 2018-09-21 DIAGNOSIS — R0989 Other specified symptoms and signs involving the circulatory and respiratory systems: Secondary | ICD-10-CM

## 2018-09-21 NOTE — Progress Notes (Signed)
Abdominal orta duplex performed     No evidence of abdominal aortic aneurysm or stenosis visualized .   No evidence of thrombus in IVC. No evidence of stenosis or aneuyrsm in proximal left and right iliac arteries . No evidence of dilitation was noted in the Inferior Vena Cava and common Iliac artery.     09/21/18 Mathew Randolph

## 2018-09-24 ENCOUNTER — Telehealth: Payer: Self-pay | Admitting: *Deleted

## 2018-09-24 MED ORDER — AMLODIPINE BESYLATE 10 MG PO TABS: 10.0000 mg | ORAL_TABLET | Freq: Every day | ORAL | 3 refills | Status: DC

## 2018-09-24 NOTE — Telephone Encounter (Signed)
Change amlodipine to 10 mg daily and follow blood pressure. Kirk Ruths, MD

## 2018-09-24 NOTE — Telephone Encounter (Signed)
Spoke with pt, aware of abdominal US. He also reports since the medication change his bp is running higher than his normal. At last ov his metoprolol was decreased to 25 mg once daily and he was started on amlodipine 5 mg daily. His bp is running 150-160/80-88. Will forward for dr Stanford Breed review

## 2018-09-24 NOTE — Telephone Encounter (Signed)
Spoke with pt, Aware of dr Jacalyn Lefevre recommendations. He will let us know when he needs a refill.

## 2018-10-05 ENCOUNTER — Other Ambulatory Visit: Payer: Self-pay | Admitting: Cardiology

## 2018-10-05 ENCOUNTER — Other Ambulatory Visit: Payer: Self-pay

## 2018-10-05 MED ORDER — AMLODIPINE BESYLATE 10 MG PO TABS: 10.0000 mg | ORAL_TABLET | Freq: Every day | ORAL | 3 refills | Status: DC

## 2018-10-05 NOTE — Telephone Encounter (Signed)
Called patient and advised that the medication was no just one tablet of the 10 mg instead of two 5 mg's. It was sent to preferred pharmacy.  Patient verbalized understanding.

## 2018-10-05 NOTE — Telephone Encounter (Signed)
Follow up   Per previous message patient would like to prescription sent to  [id number] - [place], [address] [phone] (Phone) [phone] (Fax)     Significant History/Details

## 2018-10-05 NOTE — Telephone Encounter (Signed)
New message  Pt c/o medication issue:  1. Name of Medication: amLODipine (NORVASC) 10 MG tablet  2. How are you currently taking this medication (dosage and times per day)? 1 time daily   3. Are you having a reaction (difficulty breathing--STAT)? No   4. What is your medication issue? Patient states that he needs a new prescription for this medication because the dosage has increased to 2 times daily.

## 2018-10-05 NOTE — Telephone Encounter (Signed)
Medication was sent to pharmacy as one tablet daily and not two tablets daily. As it is 10 mg and not 5 mg. Patient verbalized understanding.

## 2018-11-07 ENCOUNTER — Telehealth: Payer: Self-pay | Admitting: *Deleted

## 2018-11-07 NOTE — Telephone Encounter (Signed)
   Primary Cardiologist: Kirk Ruths, MD  Chart reviewed as part of pre-operative protocol coverage. Patient was contacted 11/07/2018 in reference to pre-operative risk assessment for pending surgery as outlined below.  [name] was last seen on 09/17/18 by Dr. Stanford Breed.  Since that day, Tarren MOMEN HAM has done well from a cardiac standpoint. He denies CP and dyspnea. His PCP manages his coumadin and follows his INR. Pt informed me that he has already been cleared to hold coumadin by PCP and it is already on hold.   Therefore, based on ACC/AHA guidelines, the patient would be at acceptable risk for the planned procedure without further cardiovascular testing.   I will route this recommendation to the requesting party via Epic fax function and remove from pre-op pool.  Please call with questions.  Lyda Jester, PA-C 11/07/2018, 3:42 PM

## 2018-11-07 NOTE — Telephone Encounter (Signed)
   [facility] Pre-operative Risk Assessment    Request for surgical clearance:  1. What type of surgery is being performed? Penile implant revision   2. When is this surgery scheduled? 11/09/18   3. What type of clearance is required (medical clearance vs. Pharmacy clearance to hold med vs. Both)? both  4. Are there any medications that need to be held prior to surgery and how long?warfarin    5. Practice name and name of physician performing surgery? Paul perito md   6. What is your office phone number [phone]    7.   What is your office fax number [phone]  8.   Anesthesia type (None, local, MAC, general) ? Not listed   Mathew Randolph 11/07/2018, 8:25 AM  _________________________________________________________________   (provider comments below)

## 2018-11-09 ENCOUNTER — Telehealth: Payer: Self-pay | Admitting: *Deleted

## 2018-11-09 NOTE — Telephone Encounter (Signed)
Faxed over Blood work from 08/24/18 and EKG report from 09/12/18 to [facility] in [place]. Pt is in hospital awaiting surgery and they requested these test results. Pt informed us of this request and gave [place] the fax number: [phone]

## 2018-12-17 ENCOUNTER — Encounter (INDEPENDENT_AMBULATORY_CARE_PROVIDER_SITE_OTHER): Payer: Self-pay | Admitting: Orthopaedic Surgery

## 2018-12-17 ENCOUNTER — Ambulatory Visit (INDEPENDENT_AMBULATORY_CARE_PROVIDER_SITE_OTHER): Payer: Medicare Other | Admitting: Orthopaedic Surgery

## 2018-12-17 ENCOUNTER — Ambulatory Visit (INDEPENDENT_AMBULATORY_CARE_PROVIDER_SITE_OTHER): Payer: Medicare Other

## 2018-12-17 VITALS — BP 124/90 | HR 63 | Wt 226.0 lb

## 2018-12-17 DIAGNOSIS — M25561 Pain in right knee: Secondary | ICD-10-CM

## 2018-12-17 MED ORDER — METHYLPREDNISOLONE ACETATE 40 MG/ML IJ SUSP
80.0000 mg | INTRAMUSCULAR | Status: AC | PRN
  Administered 2018-12-17: 80 mg

## 2018-12-17 MED ORDER — LIDOCAINE HCL 1 % IJ SOLN
2.0000 mL | INTRAMUSCULAR | Status: AC | PRN
  Administered 2018-12-17: 2 mL

## 2018-12-17 MED ORDER — BUPIVACAINE HCL 0.5 % IJ SOLN
2.0000 mL | INTRAMUSCULAR | Status: AC | PRN
  Administered 2018-12-17: 2 mL via INTRA_ARTICULAR
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
## 2019-04-24 ENCOUNTER — Telehealth: Payer: Self-pay | Admitting: Cardiology

## 2019-04-24 NOTE — Telephone Encounter (Signed)
Patient called stating he needs a letter faxed to his urologist in [place] giving cardiac clearance for him to have sedation for a urology procedure.  Letter needs to be faxed to [name] at [phone]  Please call patient [phone] with questions

## 2019-04-24 NOTE — Telephone Encounter (Signed)
Left message for pt to call.

## 2019-04-25 NOTE — Telephone Encounter (Signed)
Patient tried to call you back.  His surgery is next 05/03/2019 and needs to have this letter taken care of by tomorrow if possible he says.  Please call patient to discuss [phone]

## 2019-04-25 NOTE — Telephone Encounter (Signed)
Ok for procedure from cardiac standpoint; resume coumadin ASAP following surgery Kirk Ruths

## 2019-04-25 NOTE — Telephone Encounter (Signed)
Spoke with pt, he is scheduled for a cystoscope and they are dilating the urethra. He has already stopped his warfarin. The procedure is done under general anesthesia. Will forward for dr Stanford Breed review

## 2019-04-25 NOTE — Telephone Encounter (Signed)
Will fax this note to the number provided. 

## 2019-04-25 NOTE — Telephone Encounter (Signed)
Per pt returning this office's call please give him a call back.

## 2019-04-26 ENCOUNTER — Telehealth: Payer: Self-pay | Admitting: Cardiology

## 2019-04-26 NOTE — Telephone Encounter (Signed)
Patient needs to schedule an appt with Dr Stanford Breed.  He had EKG today in [place] in preparation for his urology procedure and it was abnormal therefore he had to cancel his procedure.   Please call patient to discuss [phone]

## 2019-04-26 NOTE — Telephone Encounter (Signed)
Faxed via Epic to requesting provider 

## 2019-04-30 NOTE — Telephone Encounter (Signed)
Spoke with pt, his medical doctor did his ECG pre-op and he is back in atrial fib. He denies chest pain just gets out of breath easier. He will bring the ECG to the follow up appointment.

## 2019-05-08 ENCOUNTER — Ambulatory Visit (INDEPENDENT_AMBULATORY_CARE_PROVIDER_SITE_OTHER): Payer: Medicare Other | Admitting: Adult Health

## 2019-05-08 ENCOUNTER — Encounter: Payer: Self-pay | Admitting: Adult Health

## 2019-05-08 ENCOUNTER — Other Ambulatory Visit: Payer: Self-pay

## 2019-05-08 VITALS — BP 146/78 | HR 106 | Ht 71.0 in | Wt 230.4 lb

## 2019-05-08 DIAGNOSIS — R0789 Other chest pain: Secondary | ICD-10-CM | POA: Diagnosis not present

## 2019-05-08 DIAGNOSIS — I480 Paroxysmal atrial fibrillation: Secondary | ICD-10-CM

## 2019-05-08 DIAGNOSIS — I10 Essential (primary) hypertension: Secondary | ICD-10-CM

## 2019-05-08 DIAGNOSIS — D6862 Lupus anticoagulant syndrome: Secondary | ICD-10-CM

## 2019-05-08 DIAGNOSIS — E7800 Pure hypercholesterolemia, unspecified: Secondary | ICD-10-CM

## 2019-05-08 DIAGNOSIS — Z95828 Presence of other vascular implants and grafts: Secondary | ICD-10-CM

## 2019-05-08 MED ORDER — METOPROLOL SUCCINATE ER 50 MG PO TB24: 50.0000 mg | ORAL_TABLET | Freq: Every day | ORAL | 3 refills | Status: DC

## 2019-05-08 NOTE — Patient Instructions (Addendum)
Medication Instructions:  INCREASE METOPROLOL 50MG  DAILY If you need a refill on your cardiac medications before your next appointment, please call your pharmacy.  Special Instructions: PLEASE FOLLOW RAQUEL's INSTRUCTIONS FOR YOUR COUMADIN  Follow-Up: You will need a follow up appointment on 05-22-2019 @ [place], DNP, AACC  You will need a follow up appointment on 06-10-2019 @ 1020AM WITH Kirk Ruths, MD or one of the following Advanced Practice Providers on your designated Care Team:  Kerin Ransom, [place] Roby Lofts, PA-C Sande Rives, [place]      At [facility], you and your health needs are our priority.  As part of our continuing mission to provide you with exceptional heart care, we have created designated Provider Care Teams.  These Care Teams include your primary Cardiologist (physician) and Advanced Practice Providers (APPs -  Physician Assistants and Nurse Practitioners) who all work together to provide you with the care you need, when you need it.  Thank you for choosing [facility] at [facility]!!    .MYA
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
## 2019-05-09 NOTE — Addendum Note (Signed)
Addended by: Venetia Maxon on: 05/09/2019 11:25 AM   Modules accepted: Orders

## 2019-05-13 ENCOUNTER — Ambulatory Visit (INDEPENDENT_AMBULATORY_CARE_PROVIDER_SITE_OTHER): Payer: Medicare Other | Admitting: Pharmacist

## 2019-05-13 DIAGNOSIS — Z7901 Long term (current) use of anticoagulants: Secondary | ICD-10-CM

## 2019-05-13 DIAGNOSIS — I480 Paroxysmal atrial fibrillation: Secondary | ICD-10-CM

## 2019-05-13 LAB — POCT INR: INR: 1.7 — AB (ref 2.0–3.0)

## 2019-05-15 ENCOUNTER — Other Ambulatory Visit: Payer: Self-pay | Admitting: Adult Health

## 2019-05-15 ENCOUNTER — Ambulatory Visit (INDEPENDENT_AMBULATORY_CARE_PROVIDER_SITE_OTHER): Payer: Medicare Other | Admitting: Pharmacist Clinician (PhC)/ Clinical Pharmacy Specialist

## 2019-05-15 ENCOUNTER — Other Ambulatory Visit: Payer: Self-pay

## 2019-05-15 ENCOUNTER — Telehealth: Payer: Self-pay | Admitting: Cardiology

## 2019-05-15 DIAGNOSIS — Z79899 Other long term (current) drug therapy: Secondary | ICD-10-CM

## 2019-05-15 DIAGNOSIS — I480 Paroxysmal atrial fibrillation: Secondary | ICD-10-CM | POA: Diagnosis not present

## 2019-05-15 DIAGNOSIS — Z7901 Long term (current) use of anticoagulants: Secondary | ICD-10-CM

## 2019-05-15 LAB — POCT INR: INR: 2.4 (ref 2.0–3.0)

## 2019-05-15 NOTE — Telephone Encounter (Signed)
PT CALLED BACK HE IS UNABLE TO MAKE IT HE NEEDS TO RESCHEDULE TEE/DCCV 05-31-2019@11AM  ARRIVE 10AM  Letter printed and mailed. Pt notified

## 2019-05-15 NOTE — Progress Notes (Signed)
TEE/DCCV dx=afib INR is therapeutic  First date with Dr Stanford Breed 05-31-2019 @ 10am arrive 9am

## 2019-05-15 NOTE — Telephone Encounter (Signed)
Patient called to schedule his cardioversion. He states he had his PT/INR done this morning.

## 2019-05-17 ENCOUNTER — Other Ambulatory Visit (HOSPITAL_COMMUNITY): Payer: Medicare Other

## 2019-05-22 ENCOUNTER — Ambulatory Visit: Payer: Medicare Other | Admitting: Adult Health

## 2019-05-22 ENCOUNTER — Other Ambulatory Visit: Payer: Self-pay

## 2019-05-22 DIAGNOSIS — I480 Paroxysmal atrial fibrillation: Secondary | ICD-10-CM

## 2019-05-22 NOTE — Progress Notes (Signed)
inr

## 2019-05-27 ENCOUNTER — Other Ambulatory Visit (HOSPITAL_COMMUNITY)
Admission: RE | Admit: 2019-05-27 | Discharge: 2019-05-27 | Disposition: A | Discharge status: Home / Self Care | Payer: Medicare Other | Source: Ambulatory Visit | Attending: Cardiology | Admitting: Cardiology

## 2019-05-27 DIAGNOSIS — Z1159 Encounter for screening for other viral diseases: Secondary | ICD-10-CM | POA: Insufficient documentation

## 2019-05-27 LAB — SARS CORONAVIRUS 2 (TAT 6-24 HRS): SARS Coronavirus 2: NEGATIVE

## 2019-05-28 LAB — CBC
Hematocrit: 46.2 % (ref 37.5–51.0)
Hemoglobin: 14.6 g/dL (ref 13.0–17.7)
MCH: 24.5 pg — ABNORMAL LOW (ref 26.6–33.0)
MCHC: 31.6 g/dL (ref 31.5–35.7)
MCV: 78 fL — ABNORMAL LOW (ref 79–97)
Platelets: 302 10*3/uL (ref 150–450)
RBC: 5.96 x10E6/uL — ABNORMAL HIGH (ref 4.14–5.80)
RDW: 17.9 % — ABNORMAL HIGH (ref 11.6–15.4)
WBC: 7.8 10*3/uL (ref 3.4–10.8)

## 2019-05-28 LAB — BASIC METABOLIC PANEL
BUN/Creatinine Ratio: 12 (ref 10–24)
BUN: 20 mg/dL (ref 8–27)
CO2: 24 mmol/L (ref 20–29)
Calcium: 9.5 mg/dL (ref 8.6–10.2)
Chloride: 102 mmol/L (ref 96–106)
Creatinine, Ser: 1.68 mg/dL — ABNORMAL HIGH (ref 0.76–1.27)
GFR calc Af Amer: 46 mL/min/{1.73_m2} — ABNORMAL LOW (ref >59)
GFR calc non Af Amer: 40 mL/min/{1.73_m2} — ABNORMAL LOW (ref >59)
Glucose: 83 mg/dL (ref 65–99)
Potassium: 5.3 mmol/L — ABNORMAL HIGH (ref 3.5–5.2)
Sodium: 143 mmol/L (ref 134–144)

## 2019-05-28 LAB — PROTIME-INR
INR: 3.3 — ABNORMAL HIGH (ref 0.8–1.2)
Prothrombin Time: 32.5 s — ABNORMAL HIGH (ref 9.1–12.0)

## 2019-05-31 ENCOUNTER — Ambulatory Visit (HOSPITAL_BASED_OUTPATIENT_CLINIC_OR_DEPARTMENT_OTHER)
Admission: RE | Admit: 2019-05-31 | Discharge: 2019-05-31 | Disposition: A | Discharge status: Home / Self Care | Payer: Medicare Other | Source: Ambulatory Visit | Attending: Adult Health | Admitting: Adult Health

## 2019-05-31 ENCOUNTER — Ambulatory Visit (HOSPITAL_COMMUNITY): Payer: Medicare Other | Admitting: Anesthesiology

## 2019-05-31 ENCOUNTER — Encounter (HOSPITAL_COMMUNITY)
Admission: RE | Disposition: A | Discharge status: Home / Self Care | Payer: Self-pay | Source: Ambulatory Visit | Attending: Cardiology

## 2019-05-31 ENCOUNTER — Ambulatory Visit (HOSPITAL_COMMUNITY)
Admission: RE | Admit: 2019-05-31 | Discharge: 2019-05-31 | Disposition: A | Discharge status: Home / Self Care | Payer: Medicare Other | Source: Ambulatory Visit | Attending: Cardiology | Admitting: Cardiology

## 2019-05-31 ENCOUNTER — Encounter (HOSPITAL_COMMUNITY): Payer: Self-pay | Admitting: Cardiology

## 2019-05-31 ENCOUNTER — Other Ambulatory Visit: Payer: Self-pay

## 2019-05-31 DIAGNOSIS — I4819 Other persistent atrial fibrillation: Secondary | ICD-10-CM | POA: Diagnosis not present

## 2019-05-31 DIAGNOSIS — Z791 Long term (current) use of non-steroidal anti-inflammatories (NSAID): Secondary | ICD-10-CM | POA: Diagnosis not present

## 2019-05-31 DIAGNOSIS — K219 Gastro-esophageal reflux disease without esophagitis: Secondary | ICD-10-CM | POA: Diagnosis not present

## 2019-05-31 DIAGNOSIS — F329 Major depressive disorder, single episode, unspecified: Secondary | ICD-10-CM | POA: Diagnosis not present

## 2019-05-31 DIAGNOSIS — I340 Nonrheumatic mitral (valve) insufficiency: Secondary | ICD-10-CM | POA: Diagnosis not present

## 2019-05-31 DIAGNOSIS — Z888 Allergy status to other drugs, medicaments and biological substances status: Secondary | ICD-10-CM | POA: Insufficient documentation

## 2019-05-31 DIAGNOSIS — Z881 Allergy status to other antibiotic agents status: Secondary | ICD-10-CM | POA: Insufficient documentation

## 2019-05-31 DIAGNOSIS — Z7901 Long term (current) use of anticoagulants: Secondary | ICD-10-CM | POA: Insufficient documentation

## 2019-05-31 DIAGNOSIS — I10 Essential (primary) hypertension: Secondary | ICD-10-CM | POA: Insufficient documentation

## 2019-05-31 DIAGNOSIS — J45909 Unspecified asthma, uncomplicated: Secondary | ICD-10-CM | POA: Insufficient documentation

## 2019-05-31 DIAGNOSIS — Z79899 Other long term (current) drug therapy: Secondary | ICD-10-CM | POA: Diagnosis not present

## 2019-05-31 DIAGNOSIS — Z8546 Personal history of malignant neoplasm of prostate: Secondary | ICD-10-CM | POA: Diagnosis not present

## 2019-05-31 DIAGNOSIS — M1990 Unspecified osteoarthritis, unspecified site: Secondary | ICD-10-CM | POA: Diagnosis not present

## 2019-05-31 DIAGNOSIS — E785 Hyperlipidemia, unspecified: Secondary | ICD-10-CM | POA: Diagnosis not present

## 2019-05-31 DIAGNOSIS — Z882 Allergy status to sulfonamides status: Secondary | ICD-10-CM | POA: Diagnosis not present

## 2019-05-31 DIAGNOSIS — Z7951 Long term (current) use of inhaled steroids: Secondary | ICD-10-CM | POA: Diagnosis not present

## 2019-05-31 DIAGNOSIS — I480 Paroxysmal atrial fibrillation: Secondary | ICD-10-CM | POA: Diagnosis not present

## 2019-05-31 DIAGNOSIS — E669 Obesity, unspecified: Secondary | ICD-10-CM | POA: Diagnosis not present

## 2019-05-31 DIAGNOSIS — I517 Cardiomegaly: Secondary | ICD-10-CM | POA: Diagnosis not present

## 2019-05-31 DIAGNOSIS — Z8551 Personal history of malignant neoplasm of bladder: Secondary | ICD-10-CM | POA: Diagnosis not present

## 2019-05-31 DIAGNOSIS — Z87891 Personal history of nicotine dependence: Secondary | ICD-10-CM | POA: Insufficient documentation

## 2019-05-31 DIAGNOSIS — R7303 Prediabetes: Secondary | ICD-10-CM | POA: Diagnosis not present

## 2019-05-31 DIAGNOSIS — Z6832 Body mass index (BMI) 32.0-32.9, adult: Secondary | ICD-10-CM | POA: Insufficient documentation

## 2019-05-31 HISTORY — PX: CARDIOVERSION: SHX1299

## 2019-05-31 HISTORY — PX: TEE WITHOUT CARDIOVERSION: SHX5443

## 2019-05-31 SURGERY — CARDIOVERSION
Anesthesia: Monitor Anesthesia Care

## 2019-05-31 MED ORDER — PHENYLEPHRINE 40 MCG/ML (10ML) SYRINGE FOR IV PUSH (FOR BLOOD PRESSURE SUPPORT)
PREFILLED_SYRINGE | INTRAVENOUS | Status: DC | PRN
  Administered 2019-05-31 (×2): 80 ug via INTRAVENOUS

## 2019-05-31 MED ORDER — PROPOFOL 500 MG/50ML IV EMUL
INTRAVENOUS | Status: DC | PRN
  Administered 2019-05-31: 100 ug/kg/min via INTRAVENOUS

## 2019-05-31 MED ORDER — PROPOFOL 10 MG/ML IV BOLUS
INTRAVENOUS | Status: DC | PRN
  Administered 2019-05-31: 20 mg via INTRAVENOUS
  Administered 2019-05-31: 50 mg via INTRAVENOUS

## 2019-05-31 MED ORDER — BUTAMBEN-TETRACAINE-BENZOCAINE 2-2-14 % EX AERO
INHALATION_SPRAY | CUTANEOUS | Status: DC | PRN
  Administered 2019-05-31: 2 via TOPICAL

## 2019-05-31 MED ORDER — SODIUM CHLORIDE 0.9 % IV SOLN
INTRAVENOUS | Status: DC
  Administered 2019-05-31: 11:00:00 via INTRAVENOUS

## 2019-05-31 NOTE — Progress Notes (Signed)
    Transesophageal Echocardiogram Note  JEWELZ KOBUS [id number] 08-13-1946  Procedure: Transesophageal Echocardiogram Indications: atrial fibrillation  Procedure Details Consent: Obtained Time Out: Verified patient identification, verified procedure, site/side was marked, verified correct patient position, special equipment/implants available, Radiology Safety Procedures followed,  medications/allergies/relevent history reviewed, required imaging and test results available.  Performed  Medications:  Pt sedated by anesthesia with diprovan 220 mg IV. Treated with neosynephrine 80 mg for hypotension.  Normal LV function; moderate biatrial enlargement; mild RVE; no LAA thrombus; mild MR and TR. Pt subsequently underwent DCCV with 120J to sinus bradycardia.   Complications: No apparent complications Patient did tolerate procedure well.  Kirk Ruths, MD
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
## 2019-05-31 NOTE — Interval H&P Note (Signed)
History and Physical Interval Note:  05/31/2019 10:13 AM  Mathew Randolph  has presented today for surgery, with the diagnosis of AFIB.  The various methods of treatment have been discussed with the patient and family. After consideration of risks, benefits and other options for treatment, the patient has consented to  Procedure(s): CARDIOVERSION (N/A) TRANSESOPHAGEAL ECHOCARDIOGRAM (TEE) (N/A) as a surgical intervention.  The patient's history has been reviewed, patient examined, no change in status, stable for surgery.  I have reviewed the patient's chart and labs.  Questions were answered to the patient's satisfaction.     Kirk Ruths

## 2019-05-31 NOTE — Anesthesia Postprocedure Evaluation (Signed)
Anesthesia Post Note  Patient: Mathew Randolph  Procedure(s) Performed: CARDIOVERSION (N/A ) TRANSESOPHAGEAL ECHOCARDIOGRAM (TEE) (N/A )     Patient location during evaluation: PACU Anesthesia Type: General Level of consciousness: awake and alert Pain management: pain level controlled Vital Signs Assessment: post-procedure vital signs reviewed and stable Respiratory status: spontaneous breathing, nonlabored ventilation and respiratory function stable Cardiovascular status: blood pressure returned to baseline and stable Postop Assessment: no apparent nausea or vomiting Anesthetic complications: no    Last Vitals:  Vitals:   05/31/19 1200 05/31/19 1210  BP: (!) 90/59 99/68  Pulse: (!) 53 (!) 58  Resp: 10 12  Temp:    SpO2: 98% 98%    Last Pain:  Vitals:   05/31/19 1210  TempSrc:   PainSc: 0-No pain                 Audry Pili

## 2019-05-31 NOTE — Discharge Instructions (Signed)
Electrical Cardioversion, Care After °This sheet gives you information about how to care for yourself after your procedure. Your health care provider may also give you more specific instructions. If you have problems or questions, contact your health care provider. °What can I expect after the procedure? °After the procedure, it is common to have: °· Some redness on the skin where the shocks were given. °Follow these instructions at home: ° °· Do not drive for 24 hours if you were given a medicine to help you relax (sedative). °· Take over-the-counter and prescription medicines only as told by your health care provider. °· Ask your health care provider how to check your pulse. Check it often. °· Rest for 48 hours after the procedure or as told by your health care provider. °· Avoid or limit your caffeine use as told by your health care provider. °Contact a health care provider if: °· You feel like your heart is beating too quickly or your pulse is not regular. °· You have a serious muscle cramp that does not go away. °Get help right away if: ° °· You have discomfort in your chest. °· You are dizzy or you feel faint. °· You have trouble breathing or you are short of breath. °· Your speech is slurred. °· You have trouble moving an arm or leg on one side of your body. °· Your fingers or toes turn cold or blue. °This information is not intended to replace advice given to you by your health care provider. Make sure you discuss any questions you have with your health care provider. °Document Released: 09/11/2013 Document Revised: 06/24/2016 Document Reviewed: 05/27/2016 °Elsevier Interactive Patient Education © 2019 [employer] ° °
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
## 2019-05-31 NOTE — Progress Notes (Signed)
  Echocardiogram Echocardiogram Transesophageal has been performed.  Drucella Karbowski L Androw 05/31/2019, 12:02 PM

## 2019-05-31 NOTE — Transfer of Care (Signed)
Immediate Anesthesia Transfer of Care Note  Patient: [name]  Procedure(s) Performed: CARDIOVERSION (N/A ) TRANSESOPHAGEAL ECHOCARDIOGRAM (TEE) (N/A )  Patient Location: PACU  Anesthesia Type:MAC  Level of Consciousness: awake, alert  and oriented  Airway & Oxygen Therapy: Patient Spontanous Breathing and Patient connected to nasal cannula oxygen  Post-op Assessment: Report given to RN and Post -op Vital signs reviewed and stable  Post vital signs: Reviewed and stable  Last Vitals:  Vitals Value Taken Time  BP 147/103 05/31/19 1120  Temp    Pulse 63 05/31/19 1120  Resp 13 05/31/19 1120  SpO2 96 % 05/31/19 1120  Vitals shown include unvalidated device data.  Last Pain:  Vitals:   05/31/19 1042  TempSrc: Oral  PainSc: 0-No pain         Complications: No apparent anesthesia complications

## 2019-05-31 NOTE — Anesthesia Procedure Notes (Signed)
Procedure Name: MAC Date/Time: 05/31/2019 11:02 AM Performed by: Marsa Aris, CRNA Pre-anesthesia Checklist: Patient identified, Emergency Drugs available, Suction available, Patient being monitored and Timeout performed Patient Re-evaluated:Patient Re-evaluated prior to induction Oxygen Delivery Method: Nasal cannula Preoxygenation: Pre-oxygenation with 100% oxygen Induction Type: IV induction

## 2019-06-02 ENCOUNTER — Encounter (HOSPITAL_COMMUNITY): Payer: Self-pay | Admitting: Cardiology
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
## 2019-06-06 ENCOUNTER — Telehealth: Payer: Self-pay | Admitting: Cardiology

## 2019-06-06 NOTE — Telephone Encounter (Signed)
.  ° ° °  COVID-19 Pre-Screening Questions: ° °• In the past 7 to 10 days have you had a cough,  shortness of breath, headache, congestion, fever (100 or greater) body aches, chills, sore throat, or sudden loss of taste or sense of smell? no °• Have you been around anyone with known Covid 19. no °• Have you been around anyone who is awaiting Covid 19 test results in the past 7 to 10 days?noHave you been around anyone who has been exposed to Covid 19, or has mentioned symptoms of Covid 19 within the past 7 to 10 days?no ° °If you have any concerns/questions about symptoms patients report during screening (either on the phone or at threshold). Contact the provider seeing the patient or DOD for further guidance.  If neither are available contact a member of the leadership team. ° ° °I called pt to confirm his appt with Dr Crenshaw on 06-10-19. °   ° ° ° ° ° °

## 2019-06-10 ENCOUNTER — Encounter: Payer: Self-pay | Admitting: *Deleted

## 2019-06-10 ENCOUNTER — Other Ambulatory Visit: Payer: Self-pay

## 2019-06-10 ENCOUNTER — Encounter: Payer: Self-pay | Admitting: Cardiology

## 2019-06-10 ENCOUNTER — Ambulatory Visit (INDEPENDENT_AMBULATORY_CARE_PROVIDER_SITE_OTHER): Payer: Medicare Other | Admitting: Cardiology

## 2019-06-10 VITALS — BP 140/66 | HR 54 | Temp 97.5°F | Ht 71.0 in | Wt 228.0 lb

## 2019-06-10 DIAGNOSIS — E7800 Pure hypercholesterolemia, unspecified: Secondary | ICD-10-CM | POA: Diagnosis not present

## 2019-06-10 DIAGNOSIS — I480 Paroxysmal atrial fibrillation: Secondary | ICD-10-CM | POA: Diagnosis not present

## 2019-06-10 DIAGNOSIS — R9431 Abnormal electrocardiogram [ECG] [EKG]: Secondary | ICD-10-CM | POA: Diagnosis not present

## 2019-06-10 DIAGNOSIS — I10 Essential (primary) hypertension: Secondary | ICD-10-CM | POA: Diagnosis not present

## 2019-06-10 NOTE — Patient Instructions (Signed)
Medication Instructions:  NO CHANGE If you need a refill on your cardiac medications before your next appointment, please call your pharmacy.   Lab work: If you have labs (blood work) drawn today and your tests are completely normal, you will receive your results only by: Marland Kitchen MyChart Message (if you have MyChart) OR . A paper copy in the mail If you have any lab test that is abnormal or we need to change your treatment, we will call you to review the results.  Testing/Procedures: Your physician has requested that you have a lexiscan myoview. For further information please visit [email or web address]. Please follow instruction sheet, as given. [facility]    Follow-Up: At [facility], you and your health needs are our priority.  As part of our continuing mission to provide you with exceptional heart care, we have created designated Provider Care Teams.  These Care Teams include your primary Cardiologist (physician) and Advanced Practice Providers (APPs -  Physician Assistants and Nurse Practitioners) who all work together to provide you with the care you need, when you need it. You will need a follow up appointment in 6 months.  Please call our office 2 months in advance to schedule this appointment.  You may see Kirk Ruths, MD or one of the following Advanced Practice Providers on your designated Care Team:   Kerin Ransom, PA-C Roby Lofts, [place] . Sande Rives, PA-C

## 2019-06-21 ENCOUNTER — Telehealth (HOSPITAL_COMMUNITY): Payer: Self-pay

## 2019-06-21 NOTE — Telephone Encounter (Signed)
Encounter complete. 

## 2019-06-26 ENCOUNTER — Ambulatory Visit (HOSPITAL_COMMUNITY)
Admission: RE | Admit: 2019-06-26 | Discharge: 2019-06-26 | Disposition: A | Discharge status: Home / Self Care | Payer: Medicare Other | Source: Ambulatory Visit | Attending: Cardiovascular Disease | Admitting: Cardiovascular Disease

## 2019-06-26 ENCOUNTER — Other Ambulatory Visit: Payer: Self-pay

## 2019-06-26 DIAGNOSIS — R9431 Abnormal electrocardiogram [ECG] [EKG]: Secondary | ICD-10-CM | POA: Diagnosis not present

## 2019-06-26 LAB — MYOCARDIAL PERFUSION IMAGING
LV dias vol: 152 mL (ref 62–150)
LV sys vol: 76 mL
Peak HR: 77 {beats}/min
Rest HR: 50 {beats}/min
SDS: 0
SRS: 0
SSS: 0
TID: 1.1

## 2019-06-26 MED ORDER — TECHNETIUM TC 99M TETROFOSMIN IV KIT
31.8000 | PACK | Freq: Once | INTRAVENOUS | Status: AC | PRN
  Administered 2019-06-26: 31.8 via INTRAVENOUS
  Filled 2019-06-26: qty 32

## 2019-06-26 MED ORDER — TECHNETIUM TC 99M TETROFOSMIN IV KIT
10.5000 | PACK | Freq: Once | INTRAVENOUS | Status: AC | PRN
  Administered 2019-06-26: 10.5 via INTRAVENOUS
  Filled 2019-06-26: qty 11

## 2019-06-26 MED ORDER — REGADENOSON 0.4 MG/5ML IV SOLN
0.4000 mg | Freq: Once | INTRAVENOUS | Status: AC
  Administered 2019-06-26: 0.4 mg via INTRAVENOUS

## 2019-06-26 MED ORDER — AMINOPHYLLINE 25 MG/ML IV SOLN
75.0000 mg | Freq: Once | INTRAVENOUS | Status: AC
  Administered 2019-06-26: 75 mg via INTRAVENOUS

## 2019-09-09 ENCOUNTER — Telehealth: Payer: Self-pay | Admitting: Cardiology

## 2019-09-09 NOTE — Telephone Encounter (Signed)
Patient would like a call back to discuss his meds.

## 2019-09-09 NOTE — Telephone Encounter (Signed)
Spoke with pt, he was seen by a new phycologist and he was concerned about the adderrall  Causing pulmonary hypertension and told the patient to call us. Explained to patient his previous echocardiograms were normal and showed no evidence of PHTN.

## 2019-09-10 MED ORDER — AMLODIPINE BESYLATE 10 MG PO TABS: 10.0000 mg | ORAL_TABLET | Freq: Every day | ORAL | 3 refills | Status: AC

## 2019-09-10 NOTE — Telephone Encounter (Signed)
Follow Up   Pt c/o medication issue:  1. Name of Medication:   amLODipine (NORVASC) 10 MG tablet(Expired)   2. How are you currently taking this medication (dosage and times per day)? Take 1 tablet (10 mg total) by mouth daily  3. Are you having a reaction (difficulty breathing--STAT)? No  4. What is your medication issue? Patient states that he refill for this medication was to take 5 mg a day vs 10 mg a day. Patient states that he believes this is an error. Please call to confirm with patient whether he is to be taking 5 mg or 10 mg daily.    Pharmacy used: [id number] - [place], [place] - [address]

## 2019-09-10 NOTE — Telephone Encounter (Signed)
Spoke with pt, confirmed amlodipine is 10 mg once daily. New script sent to the pharmacy

## 2019-09-10 NOTE — Addendum Note (Signed)
Addended by: Cristopher Estimable on: 09/10/2019 10:13 AM   Modules accepted: Orders

## 2019-09-23 ENCOUNTER — Telehealth: Payer: Self-pay | Admitting: Cardiology

## 2019-09-23 NOTE — Telephone Encounter (Signed)
Patient needs a statement sent to physician that it is ok to be under for an outpatient procedure. The fax number is [phone]. Telephone: [phone] Dr. Clarisse Gouge - Urology

## 2019-09-23 NOTE — Telephone Encounter (Signed)
Called office- advised that we needed clearance sent over for patient procedure. LVM for office to call back if questions.

## 2019-11-13 ENCOUNTER — Telehealth: Payer: Self-pay | Admitting: Cardiology

## 2019-11-13 NOTE — Telephone Encounter (Signed)
Pt. Is in [place] and he thinks that his AFIB has returned. He would like to see a Dr there. He is requesting a call back to discuss his options.

## 2019-11-13 NOTE — Telephone Encounter (Signed)
Pt. Is in [place] and he says that he is in AFIB. He would like to see a Dr. There but they will not see him unless they speak with Dr. Stanford Breed. He is requesting a call back As soon as possible.

## 2019-11-13 NOTE — Telephone Encounter (Signed)
Spoke with pt, he is needing his last office note faxed to [facility] in [place]. Last office note, nuclear test and ekg faxed to [phone].
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
## 2020-01-13 ENCOUNTER — Telehealth: Payer: Self-pay | Admitting: Cardiology

## 2020-01-13 NOTE — Telephone Encounter (Signed)
New Message   Patient is calling because he was like his wife to attend the appt with him. Not because he needs medical assistance he just wants her to come

## 2020-01-13 NOTE — Telephone Encounter (Signed)
Pt advised that his wife cannot come to his visit with Dr. Stanford Breed and he can have her on speakerphone but since he reports he will be driving from [place] he is insistent that I ask Dr. Stanford Breed for permission for her to come to the visit. I explained our COVID restrictions but he was upset and says he will not adhere until he hears it form Dr. Stanford Breed.

## 2020-01-13 NOTE — Telephone Encounter (Signed)
Spoke with pt, explained the reason for our concerns. He recently had another cardioversion and would like his wife in the room for questions. Okay given for patient to have wife in room.

## 2020-01-16 ENCOUNTER — Ambulatory Visit (INDEPENDENT_AMBULATORY_CARE_PROVIDER_SITE_OTHER): Payer: Medicare Other | Admitting: Cardiology

## 2020-01-16 ENCOUNTER — Other Ambulatory Visit: Payer: Self-pay

## 2020-01-16 ENCOUNTER — Encounter: Payer: Self-pay | Admitting: Cardiology

## 2020-01-16 ENCOUNTER — Encounter (INDEPENDENT_AMBULATORY_CARE_PROVIDER_SITE_OTHER): Payer: Self-pay

## 2020-01-16 VITALS — BP 112/80 | HR 73 | Temp 97.6°F | Ht 71.0 in | Wt 225.0 lb

## 2020-01-16 DIAGNOSIS — I10 Essential (primary) hypertension: Secondary | ICD-10-CM

## 2020-01-16 DIAGNOSIS — E7800 Pure hypercholesterolemia, unspecified: Secondary | ICD-10-CM

## 2020-01-16 DIAGNOSIS — I480 Paroxysmal atrial fibrillation: Secondary | ICD-10-CM

## 2020-01-16 NOTE — Patient Instructions (Signed)

## 2020-01-18 ENCOUNTER — Telehealth: Payer: Self-pay | Admitting: Student

## 2020-01-18 MED ORDER — APIXABAN 5 MG PO TABS
5.00 | ORAL_TABLET | ORAL | Status: DC
Start: 2020-01-18 — End: 2020-01-18

## 2020-01-18 NOTE — Telephone Encounter (Signed)
   Received page from Answering Service. Called and spoke with patient. He reports that he woke up in rate controlled atrial fibrillation this morning. He states heart was not racing but he could feel it beating irregularly. Minimal lightheadedness but no other cardiac symptoms. He took his home medications which include Flecainide and Toprol with no improvement. He went to a [facility] ED and was reportedly started on Eliquis and discharged home with recommendations to follow-up in clinic on Monday. Patient somewhat frustrated that they did not do more. He states he has been told in the past that if went back into atrial fibrillation he should go to the ED for TEE/DCCV. He wants to know if he can come to [facility] for this. I informed him that unfortunately we do not do TEE/DCCV on the weekend. Recommended office visit early next week (preferable Monday) so that we can examine patient and determine if TEE/DCCV is needed.   Will route this message to scheduler so that they can call patient first thing on Monday to schedule patient an appointment. Will also route to Dr. Stanford Breed so that he is aware.   Darreld Mclean, PA-C 01/18/2020 12:14 PM

## 2020-01-19 NOTE — Telephone Encounter (Signed)
Schedule fu appt with atrial fibrillation clinic [facility]

## 2020-01-20 ENCOUNTER — Other Ambulatory Visit: Payer: Self-pay

## 2020-01-20 ENCOUNTER — Ambulatory Visit (HOSPITAL_COMMUNITY)
Admission: RE | Admit: 2020-01-20 | Discharge: 2020-01-20 | Disposition: A | Discharge status: Home / Self Care | Payer: Medicare Other | Source: Ambulatory Visit | Attending: Physician Assistant | Admitting: Physician Assistant

## 2020-01-20 ENCOUNTER — Encounter (HOSPITAL_COMMUNITY): Payer: Self-pay | Admitting: Physician Assistant

## 2020-01-20 ENCOUNTER — Telehealth: Payer: Self-pay | Admitting: Cardiology

## 2020-01-20 VITALS — BP 134/74 | HR 107 | Ht 71.0 in | Wt 228.2 lb

## 2020-01-20 DIAGNOSIS — M1990 Unspecified osteoarthritis, unspecified site: Secondary | ICD-10-CM | POA: Insufficient documentation

## 2020-01-20 DIAGNOSIS — R6510 Systemic inflammatory response syndrome (SIRS) of non-infectious origin without acute organ dysfunction: Secondary | ICD-10-CM | POA: Insufficient documentation

## 2020-01-20 DIAGNOSIS — Z6831 Body mass index (BMI) 31.0-31.9, adult: Secondary | ICD-10-CM | POA: Diagnosis not present

## 2020-01-20 DIAGNOSIS — Z7901 Long term (current) use of anticoagulants: Secondary | ICD-10-CM | POA: Insufficient documentation

## 2020-01-20 DIAGNOSIS — Z87891 Personal history of nicotine dependence: Secondary | ICD-10-CM | POA: Diagnosis not present

## 2020-01-20 DIAGNOSIS — Z79899 Other long term (current) drug therapy: Secondary | ICD-10-CM | POA: Diagnosis not present

## 2020-01-20 DIAGNOSIS — Z8546 Personal history of malignant neoplasm of prostate: Secondary | ICD-10-CM | POA: Insufficient documentation

## 2020-01-20 DIAGNOSIS — K219 Gastro-esophageal reflux disease without esophagitis: Secondary | ICD-10-CM | POA: Insufficient documentation

## 2020-01-20 DIAGNOSIS — Z7984 Long term (current) use of oral hypoglycemic drugs: Secondary | ICD-10-CM | POA: Insufficient documentation

## 2020-01-20 DIAGNOSIS — Z882 Allergy status to sulfonamides status: Secondary | ICD-10-CM | POA: Insufficient documentation

## 2020-01-20 DIAGNOSIS — I371 Nonrheumatic pulmonary valve insufficiency: Secondary | ICD-10-CM | POA: Diagnosis not present

## 2020-01-20 DIAGNOSIS — I071 Rheumatic tricuspid insufficiency: Secondary | ICD-10-CM | POA: Insufficient documentation

## 2020-01-20 DIAGNOSIS — R319 Hematuria, unspecified: Secondary | ICD-10-CM | POA: Insufficient documentation

## 2020-01-20 DIAGNOSIS — E669 Obesity, unspecified: Secondary | ICD-10-CM | POA: Diagnosis not present

## 2020-01-20 DIAGNOSIS — N529 Male erectile dysfunction, unspecified: Secondary | ICD-10-CM | POA: Diagnosis not present

## 2020-01-20 DIAGNOSIS — R9431 Abnormal electrocardiogram [ECG] [EKG]: Secondary | ICD-10-CM | POA: Diagnosis not present

## 2020-01-20 DIAGNOSIS — G4733 Obstructive sleep apnea (adult) (pediatric): Secondary | ICD-10-CM | POA: Insufficient documentation

## 2020-01-20 DIAGNOSIS — Z8551 Personal history of malignant neoplasm of bladder: Secondary | ICD-10-CM | POA: Insufficient documentation

## 2020-01-20 DIAGNOSIS — Z888 Allergy status to other drugs, medicaments and biological substances status: Secondary | ICD-10-CM | POA: Insufficient documentation

## 2020-01-20 DIAGNOSIS — J45909 Unspecified asthma, uncomplicated: Secondary | ICD-10-CM | POA: Diagnosis not present

## 2020-01-20 DIAGNOSIS — I10 Essential (primary) hypertension: Secondary | ICD-10-CM | POA: Insufficient documentation

## 2020-01-20 DIAGNOSIS — I480 Paroxysmal atrial fibrillation: Secondary | ICD-10-CM | POA: Diagnosis not present

## 2020-01-20 NOTE — Addendum Note (Signed)
Encounter addended by: Oliver Barre, PA on: 01/20/2020 4:33 PM  Actions taken: Clinical Note Signed

## 2020-01-20 NOTE — Telephone Encounter (Signed)
Spoke with patient. Patient was advised by Dr. Stanford Breed to report to the emergency room if he develops a-fib to be cardioverted.   Patient reports he went to the emergency room Saturday and no one was available to cardiovert him so they started Eliquis.   On Sunday patient went back to the emergency room due to passing blood clots and not being able to urinate, eliquis was stopped by patient.   Patient calling this morning to have cardioversion set up. Patient also states if we can't do it he can fly to [place] for $1,000 and have it done in the morning. Patient appt made with a-fib clinic at 11am. Fredia Beets, RN updated as well.

## 2020-01-20 NOTE — Telephone Encounter (Signed)
New Message  Patient is calling in to get an order for a cardioversion put in. States that he is in afib and has been to the ER twice over the weekend and needs this cardioversion. Please assist.
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
## 2020-01-20 NOTE — Telephone Encounter (Signed)
Afib clinic appt today 2/15

## 2020-01-22 ENCOUNTER — Other Ambulatory Visit: Payer: Self-pay | Admitting: Adult Health

## 2020-01-29 ENCOUNTER — Ambulatory Visit: Payer: Medicare Other | Admitting: Cardiology

## 2020-07-09 ENCOUNTER — Telehealth: Payer: Self-pay | Admitting: *Deleted

## 2020-07-09 NOTE — Telephone Encounter (Signed)
Mr.Nicasio will call us back when ready to schedule.

## 2020-10-01 ENCOUNTER — Telehealth: Payer: Self-pay | Admitting: Cardiology

## 2020-10-01 ENCOUNTER — Telehealth: Payer: Self-pay | Admitting: Oncology

## 2020-10-01 NOTE — Telephone Encounter (Signed)
Received a new hem referral from Dr. Lovena Neighbours for recurrent dvt. Mr. Mathew Randolph has been cld and scheduled to see Dr. Alen Blew on 11/9 at 2pm. Pt aware to arrive 30 minutes early.

## 2020-10-01 NOTE — Telephone Encounter (Signed)
Left message for patient to return call.

## 2020-10-01 NOTE — Telephone Encounter (Signed)
That will be fine. 

## 2020-10-01 NOTE — Telephone Encounter (Signed)
Sher is calling requesting to be worked in to be seen with someone at the [facility]. He states he is needing an appt as soon as possible due to missing his last that was due. Dr. Stanford Breed is booked until next year for both [facility] and [facility] and the [place] office does not having anything available with the PA's until December. Please advise.

## 2020-10-06 NOTE — Telephone Encounter (Signed)
Left message for patient to return call.

## 2020-10-09 ENCOUNTER — Telehealth: Payer: Self-pay | Admitting: Oncology

## 2020-10-09 NOTE — Telephone Encounter (Signed)
Received a call from Mr. Mathew Randolph to cancel his new pt appt w/Dr. Alen Blew on 11/9. Pt did not want to reschedule.

## 2020-10-13 ENCOUNTER — Encounter: Payer: Medicare Other | Admitting: Oncology

## 2020-10-15 ENCOUNTER — Telehealth: Payer: Self-pay | Admitting: Cardiology

## 2020-10-15 ENCOUNTER — Encounter: Payer: Self-pay | Admitting: General Practice

## 2020-10-15 NOTE — Telephone Encounter (Signed)
  Recall expunge letter sent for overdue 6 mo f/u 

## 2022-11-29 ENCOUNTER — Encounter (HOSPITAL_COMMUNITY): Payer: Self-pay | Admitting: Emergency Medicine

## 2022-11-29 ENCOUNTER — Emergency Department (HOSPITAL_COMMUNITY)
Admission: EM | Admit: 2022-11-29 | Discharge: 2022-11-29 | Discharge status: Against Medical Advice | Payer: Medicare Other | Attending: Student | Admitting: Student

## 2022-11-29 DIAGNOSIS — T83092A Other mechanical complication of nephrostomy catheter, initial encounter: Secondary | ICD-10-CM | POA: Insufficient documentation

## 2022-11-29 DIAGNOSIS — Z5321 Procedure and treatment not carried out due to patient leaving prior to being seen by health care provider: Secondary | ICD-10-CM | POA: Diagnosis not present

## 2022-11-29 DIAGNOSIS — Z8551 Personal history of malignant neoplasm of bladder: Secondary | ICD-10-CM | POA: Diagnosis not present

## 2022-11-29 LAB — CBC WITH DIFFERENTIAL/PLATELET
Abs Immature Granulocytes: 0.14 10*3/uL — ABNORMAL HIGH (ref 0.00–0.07)
Basophils Absolute: 0 10*3/uL (ref 0.0–0.1)
Basophils Relative: 0 %
Eosinophils Absolute: 0 10*3/uL (ref 0.0–0.5)
Eosinophils Relative: 0 %
HCT: 34.4 % — ABNORMAL LOW (ref 39.0–52.0)
Hemoglobin: 11.1 g/dL — ABNORMAL LOW (ref 13.0–17.0)
Immature Granulocytes: 1 %
Lymphocytes Relative: 5 %
Lymphs Abs: 0.8 10*3/uL (ref 0.7–4.0)
MCH: 27.4 pg (ref 26.0–34.0)
MCHC: 32.3 g/dL (ref 30.0–36.0)
MCV: 84.9 fL (ref 80.0–100.0)
Monocytes Absolute: 0.8 10*3/uL (ref 0.1–1.0)
Monocytes Relative: 4 %
Neutro Abs: 16.3 10*3/uL — ABNORMAL HIGH (ref 1.7–7.7)
Neutrophils Relative %: 90 %
Platelets: 302 10*3/uL (ref 150–400)
RBC: 4.05 MIL/uL — ABNORMAL LOW (ref 4.22–5.81)
RDW: 17.1 % — ABNORMAL HIGH (ref 11.5–15.5)
WBC: 18.1 10*3/uL — ABNORMAL HIGH (ref 4.0–10.5)
nRBC: 0 % (ref 0.0–0.2)

## 2022-11-29 LAB — COMPREHENSIVE METABOLIC PANEL
ALT: 12 U/L (ref 0–44)
AST: 20 U/L (ref 15–41)
Albumin: 2.8 g/dL — ABNORMAL LOW (ref 3.5–5.0)
Alkaline Phosphatase: 57 U/L (ref 38–126)
Anion gap: 8 (ref 5–15)
BUN: 17 mg/dL (ref 8–23)
CO2: 24 mmol/L (ref 22–32)
Calcium: 9.3 mg/dL (ref 8.9–10.3)
Chloride: 103 mmol/L (ref 98–111)
Creatinine, Ser: 1.38 mg/dL — ABNORMAL HIGH (ref 0.61–1.24)
GFR, Estimated: 53 mL/min — ABNORMAL LOW (ref >60)
Glucose, Bld: 102 mg/dL — ABNORMAL HIGH (ref 70–99)
Potassium: 4.3 mmol/L (ref 3.5–5.1)
Sodium: 135 mmol/L (ref 135–145)
Total Bilirubin: 0.5 mg/dL (ref 0.3–1.2)
Total Protein: 6.2 g/dL — ABNORMAL LOW (ref 6.5–8.1)

## 2022-11-29 LAB — I-STAT CHEM 8, ED
BUN: 18 mg/dL (ref 8–23)
Calcium, Ion: 1.2 mmol/L (ref 1.15–1.40)
Chloride: 101 mmol/L (ref 98–111)
Creatinine, Ser: 1.2 mg/dL (ref 0.61–1.24)
Glucose, Bld: 103 mg/dL — ABNORMAL HIGH (ref 70–99)
HCT: 34 % — ABNORMAL LOW (ref 39.0–52.0)
Hemoglobin: 11.6 g/dL — ABNORMAL LOW (ref 13.0–17.0)
Potassium: 4.3 mmol/L (ref 3.5–5.1)
Sodium: 134 mmol/L — ABNORMAL LOW (ref 135–145)
TCO2: 23 mmol/L (ref 22–32)

## 2022-11-29 NOTE — ED Notes (Signed)
Called for vitals. Nor response

## 2022-11-29 NOTE — ED Provider Triage Note (Signed)
Emergency Medicine Provider Triage Evaluation Note  Mathew Randolph , a 76 y.o. male  was evaluated in triage.  Pt complains of a blocked nephrostomy tube.  Patient with bladder cancer history.  States current nephrostomy tubes been in place for approximately 6 weeks.  Patient is unable to flush tube at this time and has noticed urine leaking around the ostomy.  Patient tried calling his oncologist throughout the day but was unable to get in touch with them and the nurse line recommended coming to the emergency department  Review of Systems  Positive: As above Negative: As above  Physical Exam  BP 127/73 (BP Location: Left Arm)   Pulse (!) 101   Temp 99.1 F (37.3 C) (Oral)   Resp 18   Ht '5\' 11"'$  (1.803 m)   Wt 103 kg   SpO2 98%   BMI 31.67 kg/m  Gen:   Awake, no distress   Resp:  Normal effort  MSK:   Moves extremities without difficulty  Other:    Medical Decision Making  Medically screening exam initiated at 5:47 PM.  Appropriate orders placed.  Mathew Randolph was informed that the remainder of the evaluation will be completed by another provider, this initial triage assessment does not replace that evaluation, and the importance of remaining in the ED until their evaluation is complete.     Dorothyann Peng, PA-C 11/29/22 [phone]

## 2022-11-29 NOTE — ED Triage Notes (Signed)
Pt reports stage 4 kidney cancer and has nephrostomy tube. Pt normally flushes the tube couple times a day but has not been able to today.

## 2023-03-06 DEATH — deceased
